# Patient Record
Sex: Female | Born: 1955
Health system: Southern US, Community
[De-identification: ages and names within clinical notes are randomized; demographics above are authoritative.]

## PROBLEM LIST (undated history)

## (undated) DIAGNOSIS — M1990 Unspecified osteoarthritis, unspecified site: Secondary | ICD-10-CM

## (undated) DIAGNOSIS — I219 Acute myocardial infarction, unspecified: Secondary | ICD-10-CM

## (undated) DIAGNOSIS — G709 Myoneural disorder, unspecified: Secondary | ICD-10-CM

## (undated) DIAGNOSIS — C801 Malignant (primary) neoplasm, unspecified: Secondary | ICD-10-CM

## (undated) DIAGNOSIS — K219 Gastro-esophageal reflux disease without esophagitis: Secondary | ICD-10-CM

## (undated) DIAGNOSIS — I2510 Atherosclerotic heart disease of native coronary artery without angina pectoris: Secondary | ICD-10-CM

## (undated) HISTORY — PX: TONSILLECTOMY: SUR1361

## (undated) HISTORY — PX: ABDOMINAL HYSTERECTOMY: SHX81

## (undated) HISTORY — PX: MASTECTOMY: SHX3

## (undated) HISTORY — PX: CARDIAC CATHETERIZATION: SHX172

---

## 2017-07-15 ENCOUNTER — Other Ambulatory Visit: Payer: Self-pay | Admitting: Neurosurgery

## 2017-08-05 NOTE — H&P (Signed)
Patient ID:   [id number] Patient: Destiny Dillon  Date of Birth: July 30, 1956 Visit Type: Office Visit   Date: 08/02/2017 12:45 PM Provider: Marchia Meiers. Vertell Limber MD   This 62 year old female presents for back pain.   History of Present Illness: 1.  back pain  Patient returns as scheduled noting her pain is significantly worse since previous visit in November. She describes sharp pain to the low back and left hip with sitting and standing. She is unable to bend. Patient reports she received pre-op clearance from her cardiac surgeon.           MEDICATIONS(added, continued or stopped this visit): Started Medication Directions Instruction Stopped   atorvastatin 40 mg tablet take 1 tablet by oral route  every day     celecoxib 200 mg capsule take 1 capsule by oral route  every day as needed     gabapentin 300 mg capsule take 1 capsule by oral route 3 times every day     hydrocodone 7.5 mg-acetaminophen 325 mg tablet take 1 tablet by oral route  every 4 hours as needed for pain     metoprolol tartrate 50 mg tablet take 1 tablet by oral route 2 times every day with meals     niacin 500 mg tablet take 1 tablet by oral route 3 times every day     ramipril 10 mg capsule take 1 capsule by oral route  every day     sertraline 50 mg tablet take 1 tablet by oral route  every day       ALLERGIES: Ingredient Reaction Medication Name Comment  CODEINE Lightheadedness    ERYTHROMYCIN BASE Lightheadedness; Vomiting     Reviewed, no changes.    Vitals Date Temp F BP Pulse Ht In Wt Lb BMI BSA Pain Score  08/02/2017  114/68 73 61 191.4 36.16  9/10      IMPRESSION Patient returns as scheduled with increased pain to the low back and left hip. MRI shows disc herniation and listhesis at L4-5. Stenosis noted at L5-S1. On confrontational testing, 4/5 left EHL and negative left seated SLR. Schedule L4-5 L5-S1 MAS PLIF.     Pain Management Plan Pain Scale: 9/10. Method: Numeric Pain  Intensity Scale. Location: lower back and left hip. Onset: 06/07/2014. Duration: varies. Quality: discomforting.  Schedule L4-5 L5-S1 MAS PLIF. Nurse education given. Fit for LSO brace.             Provider:  Vertell Limber MD, Marchia Meiers 08/02/2017 1:37 PM  Dictation edited by: Lucita Lora    CC Providers: Otho Darner  [address] [place] [address]-              Electronically signed by Marchia Meiers. Vertell Limber MD on 08/03/2017 05:25 PM  Patient ID:   [id number] Patient: Destiny Dillon  Date of Birth: May 15, 1956 Visit Type: Office Visit   Date: 06/07/2017 02:30 PM Provider: Marchia Meiers. Vertell Limber MD   This 62 year old female presents for back pain.   History of Present Illness: 1.  back pain  Destiny Dillon, 62 year old retired female visits for evaluation of lumbar and left greater than right leg pain and numbness.  Patient recalls no injury, noting symptoms increasing since 2015.  She notes primarily lumbar pain and left leg pain, occasionally right leg pain.  Sitting allows pain to dissipate.  Occasionally she will notice numbness in either leg while standing prolonged periods of time.  She reports both legs ache at night.  She reports currently 8/10 pain to the low back and hips. She notes bilateral leg pain at night. She notes she "does not have a life". She also reports actively losing weight and reports a 30 lb weight loss.  Physical therapy offered no relief Epidural steroid injections offered no relief RF ablation x2 offered no lasting relief  Gabapentin 300 milligrams q.h.s. Norco 7.5/325 usually taken 1 per day Celebrex 200 milligrams 1 per day  History:  MI 2010, depression/anxiety, CAD Surgical history:  Stents 2010(no longer requires anticoagulants), hysterectomy 2000, C-section 1987, tonsillectomy 1975  MRI September 2018 uploaded canopy, x-rays on canopy           MEDICATIONS(added, continued or stopped this  visit):   ALLERGIES:   Review of Systems System Neg/Pos Details  Constitutional Negative Chills, Fatigue, Fever, Malaise, Night sweats, Weight gain and Weight loss.  ENMT Negative Ear drainage, Hearing loss, Nasal drainage, Otalgia, Sinus pressure and Sore throat.  Eyes Negative Eye discharge, Eye pain and Vision changes.  Respiratory Negative Chronic cough, Cough, Dyspnea, Known TB exposure and Wheezing.  Cardio Negative Chest pain, Claudication, Edema and Irregular heartbeat/palpitations.  GI Negative Abdominal pain, Blood in stool, Change in stool pattern, Constipation, Decreased appetite, Diarrhea, Heartburn, Nausea and Vomiting.  GU Negative Dysuria, Hematuria, Polyuria (Genitourinary), Urinary frequency, Urinary incontinence and Urinary retention.  Endocrine Negative Cold intolerance, Heat intolerance, Polydipsia and Polyphagia.  Neuro Positive Numbness in extremity.  Psych Negative Anxiety, Depression and Insomnia.  Integumentary Negative Brittle hair, Brittle nails, Change in shape/size of mole(s), Hair loss, Hirsutism, Hives, Pruritus, Rash and Skin lesion.  MS Positive Back pain.  Hema/Lymph Negative Easy bleeding, Easy bruising and Lymphadenopathy.  Allergic/Immuno Negative Contact allergy, Environmental allergies, Food allergies and Seasonal allergies.  Reproductive Negative Breast discharge, Breast lumps, Dysmenorrhea, Dyspareunia, History of abnormal PAP smear, Hot flashes, Irregular menses and Vaginal discharge.   Vitals Date Temp F BP Pulse Ht In Wt Lb BMI BSA Pain Score  06/07/2017  116/74 68 61 196.2 37.07  7/10     PHYSICAL EXAM General Level of Distress: no acute distress Overall Appearance: normal  Head and Face  Right Left  Fundoscopic Exam:  normal normal    Cardiovascular Cardiac: regular rate and rhythm without murmur  Right Left  Carotid Pulses: normal normal  Respiratory Lungs: clear to auscultation  Neurological Orientation: normal Recent  and Remote Memory: normal Attention Span and Concentration:   normal Language: normal Fund of Knowledge: normal  Right Left Sensation: normal normal Upper Extremity Coordination: normal normal  Lower Extremity Coordination: normal normal  Musculoskeletal Gait and Station: normal  Right Left Upper Extremity Muscle Strength: normal normal Lower Extremity Muscle Strength: normal normal Upper Extremity Muscle Tone:  normal normal Lower Extremity Muscle Tone: normal normal   Motor Strength Upper and lower extremity motor strength was tested in the clinically pertinent muscles. Any abnormal findings will be noted below.   Right Left EHL: 4-/5 4/5   Deep Tendon Reflexes  Right Left Biceps: normal normal Triceps: normal normal Brachioradialis: normal normal Patellar: normal normal Achilles: normal normal  Cranial Nerves II. Optic Nerve/Visual Fields: normal III. Oculomotor: normal IV. Trochlear: normal V. Trigeminal: normal VI. Abducens: normal VII. Facial: normal VIII. Acoustic/Vestibular: normal IX. Glossopharyngeal: normal X. Vagus: normal XI. Spinal Accessory: normal XII. Hypoglossal: normal  Motor and other Tests Lhermittes: negative Rhomberg: negative Pronator drift: absent     Right Left Hoffman's: normal normal Clonus: normal normal Babinski: normal normal SLR: negative positive at 45 degrees  Patrick's Corky Sox): negative negative   Additional Findings:  Able to stand on toes and heels. No pain with palpation along the spine. 4-/5 right EHL. 4/5 right dorsiflexion. 4/5 left EHL. 4/5 left dorsiflexion. Full strength in upper extremities. Reflexes are symmetric. Fundoscopic exam is normal. Cranial nerve exam is normal. Negative SLR on the right. 45 degree on the left SLR. Negative Patrick's test bilaterally. 4/5 left hip abductor. 4-/5 hip abductor on the right. Bilateral neuropathy. Minimal vibration loss in bilateral legs.   DIAGNOSTIC RESULTS MRI  04/14/17: Multilevel spondylosis. L4-5: Increased moderate thecal sac stenosis. A disc bulge is demonstrated with central protrusion and caudal migration. There is bilateral neural foraminal narrowing without exiting nerve root much possible compression. L5-S1: Broad-based disc bulge possible encroachment upon the central aspects of S1 nerve roots. There is bilateral neural foraminal narrowing without exiting nerve root compromise and possible depression.    IMPRESSION Patient presents with low back and bilateral hip and leg pain, left worse than right. MRI shows ruptured disc at L4-5, neural foraminal stenosis noted at L4-5 and L5-S1. X-ray shows scoliosis and spondylosis of L4-5 (10 mm on neutral, 8 mm on extension, 12 mm on flexion). On confrontational testing, weakness in bilateral EHL hip abductor, and dorsiflexion. Neuropathy noted in bilateral legs. Patient will undergo cardiac evaluation for surgical clearance. Weight loss education given.  Completed Orders (this encounter) Order Details Reason Side Interpretation Result Initial Treatment Date Region  Lumbar Spine- AP/Lat/Flex/Ex      06/07/2017 All Levels to All Levels   Assessment/Plan # Detail Type Description   1. Assessment Lumbar radiculopathy (M54.16).           Pain Management Plan Pain Scale: 7/10. Method: Numeric Pain Intensity Scale. Location: back. Onset: 06/07/2014. Duration: varies. Quality: discomforting. Pain management follow-up plan of care: Patient taking medication as prescribed..  Patient will undergo cardiac evaluation for surgical clearance. Follow-up after evaluation with scoliosis x-rays.  My likely surgical recommendation will be for MAS PLIF L 45 and L 5 S 1 levels.  I have spoken with patient about the importance of weight loss.  Orders: Diagnostic Procedures: Assessment Procedure  M54.16 Lumbar Spine- AP/Lat/Flex/Ex             Provider:  Vertell Limber MD, Marchia Meiers 06/07/2017 3:56 PM  Dictation  edited by: Lucita Lora    CC Providers: Otho Darner  [address] [place] [address]-              Electronically signed by Marchia Meiers. Vertell Limber MD on 06/08/2017 10:44 AM

## 2017-08-05 NOTE — Pre-Procedure Instructions (Signed)
Destiny Dillon  08/05/2017      [id number] [address], [place] [address] Phone: [phone] Fax: [phone]    Your procedure is scheduled on January 11  Report to [facility] at [place].M.  Call this number if you have problems the morning of surgery:  [phone]   Remember:  Do not eat food or drink liquids after midnight.  Continue all medications as directed by your physician except follow these medication instructions before surgery below   Take these medicines the morning of surgery with A SIP OF WATER  acetaminophen (TYLENOL) if needed HYDROcodone-acetaminophen (NORCO)  metoprolol succinate (TOPROL-XL)  7 days prior to surgery STOP taking any Aspirin(unless otherwise instructed by your surgeon), Aleve, Naproxen, Ibuprofen, Motrin, Advil, Goody's, BC's, all herbal medications, fish oil, and all vitamins  Follow your doctors instructions regarding your Aspirin.  If no instructions were given by your doctor, then you will need to call the prescribing office office to get instructions.      Do not wear jewelry, make-up or nail polish.  Do not wear lotions, powders, or perfumes, or deodorant.  Do not shave 48 hours prior to surgery.  Men may shave face and neck.  Do not bring valuables to the hospital.  [facility] is not responsible for any belongings or valuables.  Contacts, dentures or bridgework may not be worn into surgery.  Leave your suitcase in the car.  After surgery it may be brought to your room.  For patients admitted to the hospital, discharge time will be determined by your treatment team.  Patients discharged the day of surgery will not be allowed to drive home.   Special instructions:   [facility]- Preparing For Surgery  Before surgery, you can play an important role. Because skin is not sterile, your skin needs to be as free of germs as possible. You can reduce the  number of germs on your skin by washing with CHG (chlorahexidine gluconate) Soap before surgery.  CHG is an antiseptic cleaner which kills germs and bonds with the skin to continue killing germs even after washing.  Please do not use if you have an allergy to CHG or antibacterial soaps. If your skin becomes reddened/irritated stop using the CHG.  Do not shave (including legs and underarms) for at least 48 hours prior to first CHG shower. It is OK to shave your face.  Please follow these instructions carefully.   1. Shower the NIGHT BEFORE SURGERY and the MORNING OF SURGERY with CHG.   2. If you chose to wash your hair, wash your hair first as usual with your normal shampoo.  3. After you shampoo, rinse your hair and body thoroughly to remove the shampoo.  4. Use CHG as you would any other liquid soap. You can apply CHG directly to the skin and wash gently with a scrungie or a clean washcloth.   5. Apply the CHG Soap to your body ONLY FROM THE NECK DOWN.  Do not use on open wounds or open sores. Avoid contact with your eyes, ears, mouth and genitals (private parts). Wash Face and genitals (private parts)  with your normal soap.  6. Wash thoroughly, paying special attention to the area where your surgery will be performed.  7. Thoroughly rinse your body with warm water from the neck down.  8. DO NOT shower/wash with your normal soap after using and rinsing off  the CHG Soap.  9. Pat yourself dry with a CLEAN TOWEL.  10. Wear CLEAN PAJAMAS to bed the night before surgery, wear comfortable clothes the morning of surgery  11. Place CLEAN SHEETS on your bed the night of your first shower and DO NOT SLEEP WITH PETS.    Day of Surgery: Do not apply any deodorants/lotions. Please wear clean clothes to the hospital/surgery center.      Please read over the following fact sheets that you were given.

## 2017-08-06 ENCOUNTER — Other Ambulatory Visit: Payer: Self-pay

## 2017-08-06 ENCOUNTER — Encounter (HOSPITAL_COMMUNITY): Payer: Self-pay | Admitting: Urology

## 2017-08-06 ENCOUNTER — Encounter (HOSPITAL_COMMUNITY)
Admission: RE | Admit: 2017-08-06 | Discharge: 2017-08-06 | Disposition: A | Discharge status: Home / Self Care | Payer: BLUE CROSS/BLUE SHIELD | Source: Ambulatory Visit | Attending: Neurosurgery | Admitting: Neurosurgery

## 2017-08-06 DIAGNOSIS — Z01812 Encounter for preprocedural laboratory examination: Secondary | ICD-10-CM | POA: Insufficient documentation

## 2017-08-06 DIAGNOSIS — I2510 Atherosclerotic heart disease of native coronary artery without angina pectoris: Secondary | ICD-10-CM | POA: Diagnosis present

## 2017-08-06 DIAGNOSIS — Z01810 Encounter for preprocedural cardiovascular examination: Secondary | ICD-10-CM | POA: Insufficient documentation

## 2017-08-06 HISTORY — DX: Acute myocardial infarction, unspecified: I21.9

## 2017-08-06 HISTORY — DX: Gastro-esophageal reflux disease without esophagitis: K21.9

## 2017-08-06 HISTORY — DX: Myoneural disorder, unspecified: G70.9

## 2017-08-06 HISTORY — DX: Unspecified osteoarthritis, unspecified site: M19.90

## 2017-08-06 HISTORY — DX: Atherosclerotic heart disease of native coronary artery without angina pectoris: I25.10

## 2017-08-06 LAB — CBC
HCT: 44.7 % (ref 36.0–46.0)
Hemoglobin: 14.2 g/dL (ref 12.0–15.0)
MCH: 31.9 pg (ref 26.0–34.0)
MCHC: 31.8 g/dL (ref 30.0–36.0)
MCV: 100.4 fL — ABNORMAL HIGH (ref 78.0–100.0)
Platelets: 268 10*3/uL (ref 150–400)
RBC: 4.45 MIL/uL (ref 3.87–5.11)
RDW: 11.7 % (ref 11.5–15.5)
WBC: 8.5 10*3/uL (ref 4.0–10.5)

## 2017-08-06 LAB — BASIC METABOLIC PANEL
Anion gap: 9 (ref 5–15)
BUN: 9 mg/dL (ref 6–20)
CALCIUM: 9.5 mg/dL (ref 8.9–10.3)
CO2: 24 mmol/L (ref 22–32)
CREATININE: 0.85 mg/dL (ref 0.44–1.00)
Chloride: 105 mmol/L (ref 101–111)
GLUCOSE: 123 mg/dL — AB (ref 65–99)
Potassium: 4.1 mmol/L (ref 3.5–5.1)
Sodium: 138 mmol/L (ref 135–145)

## 2017-08-06 LAB — TYPE AND SCREEN
ABO/RH(D): O POS
ANTIBODY SCREEN: NEGATIVE

## 2017-08-06 LAB — SURGICAL PCR SCREEN
MRSA, PCR: NEGATIVE
Staphylococcus aureus: NEGATIVE

## 2017-08-06 LAB — ABO/RH: ABO/RH(D): O POS

## 2017-08-06 NOTE — Progress Notes (Signed)
   08/06/17 1028  OBSTRUCTIVE SLEEP APNEA  Have you ever been diagnosed with sleep apnea through a sleep study? No  Do you snore loudly (loud enough to be heard through closed doors)?  1  Do you often feel tired, fatigued, or sleepy during the daytime (such as falling asleep during driving or talking to someone)? 0  Has anyone observed you stop breathing during your sleep? 0  Do you have, or are you being treated for high blood pressure? 1  BMI more than 35 kg/m2? 1  Age > 50 (1-yes) 1  Neck circumference greater than:Female 16 inches or larger, Female 17inches or larger? 1  Female Gender (Yes=1) 0  Obstructive Sleep Apnea Score 5  Score 5 or greater  Results sent to PCP

## 2017-08-06 NOTE — Progress Notes (Signed)
PCP - Pat Kocher Cardiologist - Dr. Burgess Estelle   EKG - 08/06/2017  Stress Test - 07/03/17 in care everywhere Cardiac Cath - 2010, at either [facility] or [facility]- not noted in care everywhere so request faxed to both hospitals.   Aspirin Instructions: Pt was instructed by cardiologist to keep raking Aspirin. Dr. Vertell Limber aware per patient.   Pt reports having recent kidney infection and antibiotics complete. Pt states she will call PCP today then call Dr. Melven Sartorius office as she is unsure if this has cleared up.   Anesthesia review: hx CAD with stents placed x2.   Patient denies shortness of breath, fever, cough and chest pain at PAT appointment   Patient verbalized understanding of instructions that were given to them at the PAT appointment. Patient was also instructed that they will need to review over the PAT instructions again at home before surgery.

## 2017-08-10 NOTE — Progress Notes (Signed)
Anesthesia Chart Review:  Pt is a 62 year old female scheduled for L4-5, L5-S1 maximum access PLIF on 08/13/2017 with Erline Levine, MD  - PCP is Pat Kocher, MD - Cardiologist is Royetta Crochet, MD. Last office visit 06/17/17 (notes in care everywhere)   PMH includes:  CAD (s/p DES to RCA 09/2008, s/p DES to RCA for in-stent restenosis 01/2009), GERD. Never smoker. BMI 36  Medications include: ASA 81 mg, Lipitor, metoprolol, Prilosec, ramipril  BP 115/63   Pulse 72   Temp 36.8 C   Resp 20   Ht 5\' 1"  (1.549 m)   Wt 191 lb (86.6 kg)   SpO2 97%   BMI 36.09 kg/m   Preoperative labs reviewed.    EKG 08/06/17: NSR  Stress echo 07/03/17 (care everywhere): - Normal exercise echocardiogram with no exercise-induced LV wall motion abnormalities at 91% of the maximal, age-predicted target heart rate. - The left ventricular chamber size is normal. Global left ventricular wall motion and contractility are within normal limits. The EF is estimated at 60-65%. Normal left ventricular diastolic filling is observed. - The right ventricular chamber size and systolic function are within normal limits. - There is trivial tricuspid regurgitation. - Patient followed a Bruce protocol. The patient exercised into stage 3. The total exercise duration was 6 and half minutes. The study was terminated because of fatigue. - The patient did not express feelings of chest discomfort. ST segment response to stress was normal.  Exercise capacity is fair. The patient achieved a level of 7 METS. - There were rare ventricular premature beats. - This is a negative electrocardiographic stress test. - This was a negative echocardiographic stress test.  Cardiac cath 08/15/08 [facility]): 1. LM normal 2. LCx non-dominant. Gives origin to a small OM1, and medium OM 2. OM to completely occluded at its origin and fills by bridging collaterals. Distal circumflex in the AV groove quite small, and a small distal posterolateral  branch is also quite small. 3. LAD diffusely diseased throughout its course; midsegment long tubular area of 50-60% stenosis. LAD gives off 2 proximal very small diagonal branches which have diffuse plaque, and a large D3 branch has diffuse luminal irregularities but no focal stenotic lesions. 4. Ramus intermedius has diffuse plaque disease. However no focal stenotic lesions identified 5. RCA is dominant. Gives origin to posterior descending and posterior lateral branches. RCA has diffuse luminal irregularities. Subtotal occlusion of the distal RCA proximal to its bifurcation. This is high-grade distal stenosis represents culprit lesion in this patient's ACS.  S/p DES to RCA  If no changes, I anticipate pt can proceed with surgery as scheduled.   Willeen Cass, FNP-BC [facility]Anesthesiology Phone: [phone] 08/10/2017 3:06 PM

## 2017-08-12 NOTE — Anesthesia Preprocedure Evaluation (Signed)
Anesthesia Evaluation  Patient identified by MRN, date of birth, ID band Patient awake    Reviewed: Allergy & Precautions, NPO status , Patient's Chart, lab work & pertinent test results  Airway Mallampati: II  TM Distance: >3 FB Neck ROM: Full    Dental no notable dental hx.    Pulmonary neg pulmonary ROS,    Pulmonary exam normal breath sounds clear to auscultation       Cardiovascular + CAD and + Past MI  Normal cardiovascular exam Rhythm:Regular Rate:Normal     Neuro/Psych  Neuromuscular disease negative psych ROS   GI/Hepatic Neg liver ROS, GERD  ,  Endo/Other  negative endocrine ROS  Renal/GU negative Renal ROS     Musculoskeletal negative musculoskeletal ROS (+)   Abdominal   Peds  Hematology negative hematology ROS (+)   Anesthesia Other Findings   Reproductive/Obstetrics negative OB ROS                             Anesthesia Physical Anesthesia Plan  ASA: III  Anesthesia Plan: General   Post-op Pain Management:    Induction: Intravenous  PONV Risk Score and Plan: 4 or greater and Dexamethasone, Ondansetron, Midazolam and Scopolamine patch - Pre-op  Airway Management Planned: Oral ETT  Additional Equipment: Arterial line  Intra-op Plan:   Post-operative Plan: Extubation in OR  Informed Consent: I have reviewed the patients History and Physical, chart, labs and discussed the procedure including the risks, benefits and alternatives for the proposed anesthesia with the patient or authorized representative who has indicated his/her understanding and acceptance.   Dental advisory given  Plan Discussed with: CRNA  Anesthesia Plan Comments:        Anesthesia Quick Evaluation

## 2017-08-13 ENCOUNTER — Inpatient Hospital Stay (HOSPITAL_COMMUNITY): Payer: BLUE CROSS/BLUE SHIELD

## 2017-08-13 ENCOUNTER — Other Ambulatory Visit: Payer: Self-pay

## 2017-08-13 ENCOUNTER — Encounter (HOSPITAL_COMMUNITY): Payer: Self-pay | Admitting: Surgery

## 2017-08-13 ENCOUNTER — Inpatient Hospital Stay (HOSPITAL_COMMUNITY)
Admission: RE | Admit: 2017-08-13 | Discharge: 2017-08-15 | DRG: 455 | Disposition: A | Discharge status: Home / Self Care | Payer: BLUE CROSS/BLUE SHIELD | Source: Ambulatory Visit | Attending: Neurosurgery | Admitting: Neurosurgery

## 2017-08-13 ENCOUNTER — Inpatient Hospital Stay (HOSPITAL_COMMUNITY): Payer: BLUE CROSS/BLUE SHIELD | Admitting: Emergency Medicine

## 2017-08-13 ENCOUNTER — Encounter (HOSPITAL_COMMUNITY)
Admission: RE | Disposition: A | Discharge status: Home / Self Care | Payer: Self-pay | Source: Ambulatory Visit | Attending: Neurosurgery

## 2017-08-13 ENCOUNTER — Inpatient Hospital Stay (HOSPITAL_COMMUNITY): Payer: BLUE CROSS/BLUE SHIELD | Admitting: Anesthesiology

## 2017-08-13 DIAGNOSIS — M5116 Intervertebral disc disorders with radiculopathy, lumbar region: Secondary | ICD-10-CM | POA: Diagnosis present

## 2017-08-13 DIAGNOSIS — Z79899 Other long term (current) drug therapy: Secondary | ICD-10-CM

## 2017-08-13 DIAGNOSIS — Z419 Encounter for procedure for purposes other than remedying health state, unspecified: Secondary | ICD-10-CM

## 2017-08-13 DIAGNOSIS — F419 Anxiety disorder, unspecified: Secondary | ICD-10-CM | POA: Diagnosis present

## 2017-08-13 DIAGNOSIS — I2510 Atherosclerotic heart disease of native coronary artery without angina pectoris: Secondary | ICD-10-CM | POA: Diagnosis present

## 2017-08-13 DIAGNOSIS — F329 Major depressive disorder, single episode, unspecified: Secondary | ICD-10-CM | POA: Diagnosis present

## 2017-08-13 DIAGNOSIS — M48061 Spinal stenosis, lumbar region without neurogenic claudication: Principal | ICD-10-CM | POA: Diagnosis present

## 2017-08-13 DIAGNOSIS — Z888 Allergy status to other drugs, medicaments and biological substances status: Secondary | ICD-10-CM

## 2017-08-13 DIAGNOSIS — M4726 Other spondylosis with radiculopathy, lumbar region: Secondary | ICD-10-CM | POA: Diagnosis present

## 2017-08-13 DIAGNOSIS — I252 Old myocardial infarction: Secondary | ICD-10-CM

## 2017-08-13 DIAGNOSIS — Z955 Presence of coronary angioplasty implant and graft: Secondary | ICD-10-CM | POA: Diagnosis not present

## 2017-08-13 DIAGNOSIS — M4316 Spondylolisthesis, lumbar region: Secondary | ICD-10-CM | POA: Diagnosis present

## 2017-08-13 DIAGNOSIS — M549 Dorsalgia, unspecified: Secondary | ICD-10-CM | POA: Diagnosis present

## 2017-08-13 HISTORY — PX: MAXIMUM ACCESS (MAS)POSTERIOR LUMBAR INTERBODY FUSION (PLIF) 2 LEVEL: SHX6369

## 2017-08-13 SURGERY — FOR MAXIMUM ACCESS (MAS) POSTERIOR LUMBAR INTERBODY FUSION (PLIF) 2 LEVEL
Anesthesia: General | Site: Back

## 2017-08-13 MED ORDER — ACETAMINOPHEN 650 MG RE SUPP: 650.0000 mg | RECTAL | Status: DC | PRN

## 2017-08-13 MED ORDER — BUPIVACAINE LIPOSOME 1.3 % IJ SUSP
20.0000 mL | INTRAMUSCULAR | Status: DC
  Filled 2017-08-13: qty 20

## 2017-08-13 MED ORDER — PANTOPRAZOLE SODIUM 40 MG PO TBEC
40.0000 mg | DELAYED_RELEASE_TABLET | Freq: Every day | ORAL | Status: DC
  Administered 2017-08-14: 40 mg via ORAL
  Filled 2017-08-13: qty 1

## 2017-08-13 MED ORDER — METHOCARBAMOL 1000 MG/10ML IJ SOLN
500.0000 mg | Freq: Four times a day (QID) | INTRAMUSCULAR | Status: DC | PRN
  Filled 2017-08-13: qty 5

## 2017-08-13 MED ORDER — HEMOSTATIC AGENTS (NO CHARGE) OPTIME
TOPICAL | Status: DC | PRN
  Administered 2017-08-13: 1 via TOPICAL

## 2017-08-13 MED ORDER — SODIUM CHLORIDE 0.9 % IJ SOLN
INTRAMUSCULAR | Status: AC
  Filled 2017-08-13: qty 10

## 2017-08-13 MED ORDER — ZOLPIDEM TARTRATE 5 MG PO TABS: 5.0000 mg | ORAL_TABLET | Freq: Every evening | ORAL | Status: DC | PRN

## 2017-08-13 MED ORDER — CAL MAG ZINC +D3 PO TABS: ORAL_TABLET | Freq: Every day | ORAL | Status: DC

## 2017-08-13 MED ORDER — CHLORHEXIDINE GLUCONATE CLOTH 2 % EX PADS: 6.0000 | MEDICATED_PAD | Freq: Once | CUTANEOUS | Status: DC

## 2017-08-13 MED ORDER — SODIUM CHLORIDE 0.9 % IV SOLN: 250.0000 mL | INTRAVENOUS | Status: DC

## 2017-08-13 MED ORDER — MENTHOL 3 MG MT LOZG: 1.0000 | LOZENGE | OROMUCOSAL | Status: DC | PRN

## 2017-08-13 MED ORDER — 0.9 % SODIUM CHLORIDE (POUR BTL) OPTIME
TOPICAL | Status: DC | PRN
  Administered 2017-08-13: 1000 mL

## 2017-08-13 MED ORDER — ACETAMINOPHEN 500 MG PO TABS: 1000.0000 mg | ORAL_TABLET | Freq: Three times a day (TID) | ORAL | Status: DC | PRN

## 2017-08-13 MED ORDER — HYDROMORPHONE HCL 1 MG/ML IJ SOLN
0.2500 mg | INTRAMUSCULAR | Status: DC | PRN
  Administered 2017-08-13 (×2): 0.5 mg via INTRAVENOUS

## 2017-08-13 MED ORDER — VITAMIN D3 25 MCG (1000 UNIT) PO TABS
5000.0000 [IU] | ORAL_TABLET | Freq: Every day | ORAL | Status: DC
  Administered 2017-08-14: 5000 [IU] via ORAL
  Filled 2017-08-13 (×3): qty 5

## 2017-08-13 MED ORDER — HYDROCODONE-ACETAMINOPHEN 7.5-325 MG PO TABS: 0.5000 | ORAL_TABLET | Freq: Four times a day (QID) | ORAL | Status: DC | PRN

## 2017-08-13 MED ORDER — HYDROMORPHONE HCL 1 MG/ML IJ SOLN
INTRAMUSCULAR | Status: AC
  Filled 2017-08-13: qty 1

## 2017-08-13 MED ORDER — PHENYLEPHRINE HCL 10 MG/ML IJ SOLN
INTRAMUSCULAR | Status: DC | PRN
  Administered 2017-08-13: 40 ug via INTRAVENOUS
  Administered 2017-08-13: 80 ug via INTRAVENOUS
  Administered 2017-08-13: 40 ug via INTRAVENOUS
  Administered 2017-08-13: 80 ug via INTRAVENOUS

## 2017-08-13 MED ORDER — ACYCLOVIR 400 MG PO TABS: 400.0000 mg | ORAL_TABLET | Freq: Two times a day (BID) | ORAL | Status: DC | PRN

## 2017-08-13 MED ORDER — PHENYLEPHRINE HCL 10 MG/ML IJ SOLN
INTRAMUSCULAR | Status: AC
  Filled 2017-08-13: qty 1

## 2017-08-13 MED ORDER — ALUM & MAG HYDROXIDE-SIMETH 200-200-20 MG/5ML PO SUSP: 30.0000 mL | Freq: Four times a day (QID) | ORAL | Status: DC | PRN

## 2017-08-13 MED ORDER — RISAQUAD PO CAPS
ORAL_CAPSULE | Freq: Every day | ORAL | Status: DC
  Administered 2017-08-14: 1 via ORAL
  Filled 2017-08-13 (×2): qty 1

## 2017-08-13 MED ORDER — SUFENTANIL CITRATE 50 MCG/ML IV SOLN
INTRAVENOUS | Status: AC
  Filled 2017-08-13: qty 1

## 2017-08-13 MED ORDER — KCL IN DEXTROSE-NACL 20-5-0.45 MEQ/L-%-% IV SOLN: INTRAVENOUS | Status: DC

## 2017-08-13 MED ORDER — PROPOFOL 10 MG/ML IV BOLUS
INTRAVENOUS | Status: AC
  Filled 2017-08-13: qty 20

## 2017-08-13 MED ORDER — METHOCARBAMOL 500 MG PO TABS
ORAL_TABLET | ORAL | Status: AC
  Filled 2017-08-13: qty 1

## 2017-08-13 MED ORDER — DEXAMETHASONE SODIUM PHOSPHATE 10 MG/ML IJ SOLN
INTRAMUSCULAR | Status: AC
  Filled 2017-08-13: qty 1

## 2017-08-13 MED ORDER — MORPHINE SULFATE (PF) 4 MG/ML IV SOLN: 2.0000 mg | INTRAVENOUS | Status: DC | PRN

## 2017-08-13 MED ORDER — PHENYLEPHRINE HCL 10 MG/ML IJ SOLN
INTRAVENOUS | Status: DC | PRN
  Administered 2017-08-13: 25 ug/min via INTRAVENOUS

## 2017-08-13 MED ORDER — DEXAMETHASONE SODIUM PHOSPHATE 10 MG/ML IJ SOLN
INTRAMUSCULAR | Status: DC | PRN
  Administered 2017-08-13: 10 mg via INTRAVENOUS

## 2017-08-13 MED ORDER — THROMBIN (RECOMBINANT) 5000 UNITS EX SOLR
CUTANEOUS | Status: DC | PRN
  Administered 2017-08-13: 5000 [IU] via TOPICAL

## 2017-08-13 MED ORDER — MIDAZOLAM HCL 5 MG/5ML IJ SOLN
INTRAMUSCULAR | Status: DC | PRN
  Administered 2017-08-13: 2 mg via INTRAVENOUS

## 2017-08-13 MED ORDER — SERTRALINE HCL 50 MG PO TABS
50.0000 mg | ORAL_TABLET | Freq: Every day | ORAL | Status: DC
  Administered 2017-08-13 – 2017-08-14 (×2): 50 mg via ORAL
  Filled 2017-08-13 (×3): qty 1

## 2017-08-13 MED ORDER — LIDOCAINE-EPINEPHRINE 1 %-1:100000 IJ SOLN
INTRAMUSCULAR | Status: DC | PRN
  Administered 2017-08-13: 10 mL

## 2017-08-13 MED ORDER — BUPIVACAINE HCL (PF) 0.5 % IJ SOLN
INTRAMUSCULAR | Status: AC
Start: 2017-08-13 — End: 2017-08-13
  Filled 2017-08-13: qty 30

## 2017-08-13 MED ORDER — NIACIN ER (ANTIHYPERLIPIDEMIC) 500 MG PO TBCR
500.0000 mg | EXTENDED_RELEASE_TABLET | Freq: Every day | ORAL | Status: DC
  Administered 2017-08-13 – 2017-08-14 (×2): 500 mg via ORAL
  Filled 2017-08-13 (×2): qty 1

## 2017-08-13 MED ORDER — METHOCARBAMOL 500 MG PO TABS
500.0000 mg | ORAL_TABLET | Freq: Four times a day (QID) | ORAL | Status: DC | PRN
  Administered 2017-08-13 – 2017-08-14 (×3): 500 mg via ORAL
  Filled 2017-08-13 (×2): qty 1

## 2017-08-13 MED ORDER — CEFAZOLIN SODIUM-DEXTROSE 2-4 GM/100ML-% IV SOLN
2.0000 g | INTRAVENOUS | Status: AC
  Administered 2017-08-13 (×2): 2 g via INTRAVENOUS
  Filled 2017-08-13: qty 100

## 2017-08-13 MED ORDER — VITAMIN E 180 MG (400 UNIT) PO CAPS
400.0000 [IU] | ORAL_CAPSULE | Freq: Every day | ORAL | Status: DC
  Administered 2017-08-14: 400 [IU] via ORAL
  Filled 2017-08-13: qty 1

## 2017-08-13 MED ORDER — THROMBIN (RECOMBINANT) 5000 UNITS EX SOLR
CUTANEOUS | Status: AC
  Filled 2017-08-13: qty 10000

## 2017-08-13 MED ORDER — RAMIPRIL 5 MG PO CAPS
10.0000 mg | ORAL_CAPSULE | Freq: Every evening | ORAL | Status: DC
  Administered 2017-08-14: 10 mg via ORAL
  Filled 2017-08-13: qty 2

## 2017-08-13 MED ORDER — ACETAMINOPHEN 325 MG PO TABS: 650.0000 mg | ORAL_TABLET | ORAL | Status: DC | PRN

## 2017-08-13 MED ORDER — CEFAZOLIN SODIUM-DEXTROSE 2-4 GM/100ML-% IV SOLN
2.0000 g | Freq: Three times a day (TID) | INTRAVENOUS | Status: AC
  Administered 2017-08-13 (×2): 2 g via INTRAVENOUS
  Filled 2017-08-13: qty 100

## 2017-08-13 MED ORDER — SODIUM CHLORIDE 0.9% FLUSH
3.0000 mL | Freq: Two times a day (BID) | INTRAVENOUS | Status: DC
  Administered 2017-08-13 – 2017-08-14 (×3): 3 mL via INTRAVENOUS

## 2017-08-13 MED ORDER — DIPHENHYDRAMINE HCL 25 MG PO CAPS
25.0000 mg | ORAL_CAPSULE | Freq: Every day | ORAL | Status: DC
  Administered 2017-08-13 – 2017-08-14 (×2): 25 mg via ORAL
  Filled 2017-08-13 (×2): qty 1

## 2017-08-13 MED ORDER — EPHEDRINE SULFATE-NACL 50-0.9 MG/10ML-% IV SOSY
PREFILLED_SYRINGE | INTRAVENOUS | Status: DC | PRN
  Administered 2017-08-13: 10 mg via INTRAVENOUS

## 2017-08-13 MED ORDER — SUCCINYLCHOLINE CHLORIDE 200 MG/10ML IV SOSY
PREFILLED_SYRINGE | INTRAVENOUS | Status: DC | PRN
  Administered 2017-08-13: 80 mg via INTRAVENOUS

## 2017-08-13 MED ORDER — HYDROCODONE-ACETAMINOPHEN 5-325 MG PO TABS: 1.0000 | ORAL_TABLET | ORAL | Status: DC | PRN

## 2017-08-13 MED ORDER — PANTOPRAZOLE SODIUM 40 MG IV SOLR: 40.0000 mg | Freq: Every day | INTRAVENOUS | Status: DC

## 2017-08-13 MED ORDER — LIDOCAINE-EPINEPHRINE 1 %-1:100000 IJ SOLN
INTRAMUSCULAR | Status: AC
  Filled 2017-08-13: qty 1

## 2017-08-13 MED ORDER — METOPROLOL SUCCINATE ER 50 MG PO TB24
50.0000 mg | ORAL_TABLET | Freq: Every day | ORAL | Status: DC
  Administered 2017-08-14: 50 mg via ORAL
  Filled 2017-08-13: qty 1

## 2017-08-13 MED ORDER — CEFAZOLIN SODIUM-DEXTROSE 2-4 GM/100ML-% IV SOLN
INTRAVENOUS | Status: AC
  Filled 2017-08-13: qty 100

## 2017-08-13 MED ORDER — ATORVASTATIN CALCIUM 40 MG PO TABS
40.0000 mg | ORAL_TABLET | Freq: Every day | ORAL | Status: DC
  Administered 2017-08-14: 40 mg via ORAL
  Filled 2017-08-13: qty 1

## 2017-08-13 MED ORDER — CELECOXIB 200 MG PO CAPS
200.0000 mg | ORAL_CAPSULE | Freq: Two times a day (BID) | ORAL | Status: DC
  Administered 2017-08-13 – 2017-08-14 (×3): 200 mg via ORAL
  Filled 2017-08-13 (×3): qty 1

## 2017-08-13 MED ORDER — BUPIVACAINE LIPOSOME 1.3 % IJ SUSP
INTRAMUSCULAR | Status: DC | PRN
  Administered 2017-08-13: 20 mL

## 2017-08-13 MED ORDER — SUFENTANIL CITRATE 50 MCG/ML IV SOLN
INTRAVENOUS | Status: DC | PRN
  Administered 2017-08-13: 20 ug via INTRAVENOUS
  Administered 2017-08-13: 10 ug via INTRAVENOUS

## 2017-08-13 MED ORDER — PROPOFOL 1000 MG/100ML IV EMUL
INTRAVENOUS | Status: AC
  Filled 2017-08-13: qty 100

## 2017-08-13 MED ORDER — BISACODYL 10 MG RE SUPP: 10.0000 mg | Freq: Every day | RECTAL | Status: DC | PRN

## 2017-08-13 MED ORDER — CEFAZOLIN SODIUM 1 G IJ SOLR
INTRAMUSCULAR | Status: AC
Start: 2017-08-13 — End: 2017-08-13
  Filled 2017-08-13: qty 20

## 2017-08-13 MED ORDER — DIPHENHYDRAMINE HCL 25 MG PO TABS
25.0000 mg | ORAL_TABLET | Freq: Every day | ORAL | Status: DC
  Filled 2017-08-13: qty 1

## 2017-08-13 MED ORDER — LACTATED RINGERS IV SOLN
INTRAVENOUS | Status: DC | PRN
  Administered 2017-08-13 (×2): via INTRAVENOUS

## 2017-08-13 MED ORDER — POLYETHYLENE GLYCOL 3350 17 G PO PACK
17.0000 g | PACK | Freq: Every day | ORAL | Status: DC | PRN
  Administered 2017-08-14: 17 g via ORAL
  Filled 2017-08-13 (×2): qty 1

## 2017-08-13 MED ORDER — B COMPLEX-C PO TABS
1.0000 | ORAL_TABLET | Freq: Every day | ORAL | Status: DC
  Filled 2017-08-13: qty 1

## 2017-08-13 MED ORDER — ONDANSETRON HCL 4 MG/2ML IJ SOLN
INTRAMUSCULAR | Status: DC | PRN
  Administered 2017-08-13: 4 mg via INTRAVENOUS

## 2017-08-13 MED ORDER — SODIUM CHLORIDE 0.9% FLUSH: 3.0000 mL | INTRAVENOUS | Status: DC | PRN

## 2017-08-13 MED ORDER — MIDAZOLAM HCL 2 MG/2ML IJ SOLN
INTRAMUSCULAR | Status: AC
  Filled 2017-08-13: qty 2

## 2017-08-13 MED ORDER — HYDROCODONE-ACETAMINOPHEN 5-325 MG PO TABS
ORAL_TABLET | ORAL | Status: AC
  Filled 2017-08-13: qty 2

## 2017-08-13 MED ORDER — VITAMIN C 500 MG PO TABS
500.0000 mg | ORAL_TABLET | Freq: Every day | ORAL | Status: DC
  Administered 2017-08-14: 500 mg via ORAL
  Filled 2017-08-13: qty 1

## 2017-08-13 MED ORDER — DOCUSATE SODIUM 100 MG PO CAPS
100.0000 mg | ORAL_CAPSULE | Freq: Two times a day (BID) | ORAL | Status: DC
  Administered 2017-08-13 – 2017-08-14 (×3): 100 mg via ORAL
  Filled 2017-08-13 (×3): qty 1

## 2017-08-13 MED ORDER — MEPERIDINE HCL 25 MG/ML IJ SOLN: 6.2500 mg | INTRAMUSCULAR | Status: DC | PRN

## 2017-08-13 MED ORDER — ONDANSETRON HCL 4 MG PO TABS: 4.0000 mg | ORAL_TABLET | Freq: Four times a day (QID) | ORAL | Status: DC | PRN

## 2017-08-13 MED ORDER — PHENOL 1.4 % MT LIQD: 1.0000 | OROMUCOSAL | Status: DC | PRN

## 2017-08-13 MED ORDER — BUPIVACAINE HCL (PF) 0.5 % IJ SOLN
INTRAMUSCULAR | Status: DC | PRN
  Administered 2017-08-13: 10 mL

## 2017-08-13 MED ORDER — PROMETHAZINE HCL 25 MG/ML IJ SOLN: 6.2500 mg | INTRAMUSCULAR | Status: DC | PRN

## 2017-08-13 MED ORDER — FLEET ENEMA 7-19 GM/118ML RE ENEM: 1.0000 | ENEMA | Freq: Once | RECTAL | Status: DC | PRN

## 2017-08-13 MED ORDER — PROPOFOL 10 MG/ML IV BOLUS
INTRAVENOUS | Status: DC | PRN
  Administered 2017-08-13: 140 mg via INTRAVENOUS
  Administered 2017-08-13: 60 mg via INTRAVENOUS

## 2017-08-13 MED ORDER — SCOPOLAMINE 1 MG/3DAYS TD PT72
MEDICATED_PATCH | TRANSDERMAL | Status: DC | PRN
  Administered 2017-08-13: 1 via TRANSDERMAL

## 2017-08-13 MED ORDER — HYDROCODONE-ACETAMINOPHEN 5-325 MG PO TABS
2.0000 | ORAL_TABLET | ORAL | Status: DC | PRN
  Administered 2017-08-13 – 2017-08-15 (×9): 2 via ORAL
  Filled 2017-08-13 (×7): qty 2

## 2017-08-13 MED ORDER — ONDANSETRON HCL 4 MG/2ML IJ SOLN: 4.0000 mg | Freq: Four times a day (QID) | INTRAMUSCULAR | Status: DC | PRN

## 2017-08-13 MED ORDER — ONDANSETRON HCL 4 MG/2ML IJ SOLN
INTRAMUSCULAR | Status: AC
  Filled 2017-08-13: qty 2

## 2017-08-13 MED ORDER — ASPIRIN EC 81 MG PO TBEC
81.0000 mg | DELAYED_RELEASE_TABLET | Freq: Every day | ORAL | Status: DC
  Administered 2017-08-14: 81 mg via ORAL
  Filled 2017-08-13: qty 1

## 2017-08-13 MED ORDER — LIDOCAINE 2% (20 MG/ML) 5 ML SYRINGE
INTRAMUSCULAR | Status: DC | PRN
  Administered 2017-08-13: 100 mg via INTRAVENOUS

## 2017-08-13 SURGICAL SUPPLY — 88 items
BASKET BONE COLLECTION (BASKET) ×3 IMPLANT
BLADE CLIPPER SURG (BLADE) IMPLANT
BONE CANC CHIPS 40CC CAN1/2 (Bone Implant) ×3 IMPLANT
BUR MATCHSTICK NEURO 3.0 LAGG (BURR) ×3 IMPLANT
BUR PRECISION FLUTE 5.0 (BURR) ×3 IMPLANT
BUR ROUND FLUTED 5 RND (BURR) IMPLANT
BUR ROUND FLUTED 5MM RND (BURR)
CAGE COROENT PLIF 10X28-8 LUMB (Cage) ×6 IMPLANT
CAGE PLIF 8X9X23-12 LUMBAR (Cage) ×6 IMPLANT
CANISTER SUCT 3000ML PPV (MISCELLANEOUS) ×3 IMPLANT
CAP RELINE MOD TULIP RMM (Cap) ×6 IMPLANT
CARTRIDGE OIL MAESTRO DRILL (MISCELLANEOUS) ×1 IMPLANT
CHIPS CANC BONE 40CC CAN1/2 (Bone Implant) ×1 IMPLANT
CLIP NEUROVISION LG (CLIP) ×3 IMPLANT
CONT SPEC 4OZ CLIKSEAL STRL BL (MISCELLANEOUS) ×3 IMPLANT
COVER BACK TABLE 24X17X13 BIG (DRAPES) IMPLANT
COVER BACK TABLE 60X90IN (DRAPES) ×3 IMPLANT
DECANTER SPIKE VIAL GLASS SM (MISCELLANEOUS) ×3 IMPLANT
DERMABOND ADVANCED (GAUZE/BANDAGES/DRESSINGS) ×2
DERMABOND ADVANCED .7 DNX12 (GAUZE/BANDAGES/DRESSINGS) ×1 IMPLANT
DIFFUSER DRILL AIR PNEUMATIC (MISCELLANEOUS) ×3 IMPLANT
DRAPE C-ARM 42X72 X-RAY (DRAPES) ×3 IMPLANT
DRAPE C-ARMOR (DRAPES) ×3 IMPLANT
DRAPE LAPAROTOMY 100X72X124 (DRAPES) ×3 IMPLANT
DRAPE POUCH INSTRU U-SHP 10X18 (DRAPES) ×3 IMPLANT
DRAPE SURG 17X23 STRL (DRAPES) ×3 IMPLANT
DRSG OPSITE POSTOP 4X6 (GAUZE/BANDAGES/DRESSINGS) ×3 IMPLANT
DURAPREP 26ML APPLICATOR (WOUND CARE) ×3 IMPLANT
ELECT BLADE 4.0 EZ CLEAN MEGAD (MISCELLANEOUS) ×3
ELECT REM PT RETURN 9FT ADLT (ELECTROSURGICAL) ×3
ELECTRODE BLDE 4.0 EZ CLN MEGD (MISCELLANEOUS) ×1 IMPLANT
ELECTRODE REM PT RTRN 9FT ADLT (ELECTROSURGICAL) ×1 IMPLANT
EVACUATOR 1/8 PVC DRAIN (DRAIN) IMPLANT
GAUZE SPONGE 4X4 12PLY STRL (GAUZE/BANDAGES/DRESSINGS) IMPLANT
GAUZE SPONGE 4X4 16PLY XRAY LF (GAUZE/BANDAGES/DRESSINGS) IMPLANT
GLOVE BIO SURGEON STRL SZ8 (GLOVE) ×6 IMPLANT
GLOVE BIOGEL PI IND STRL 7.5 (GLOVE) ×2 IMPLANT
GLOVE BIOGEL PI IND STRL 8 (GLOVE) ×6 IMPLANT
GLOVE BIOGEL PI IND STRL 8.5 (GLOVE) ×2 IMPLANT
GLOVE BIOGEL PI INDICATOR 7.5 (GLOVE) ×4
GLOVE BIOGEL PI INDICATOR 8 (GLOVE) ×12
GLOVE BIOGEL PI INDICATOR 8.5 (GLOVE) ×4
GLOVE ECLIPSE 7.0 STRL STRAW (GLOVE) ×3 IMPLANT
GLOVE ECLIPSE 7.5 STRL STRAW (GLOVE) ×9 IMPLANT
GLOVE ECLIPSE 8.0 STRL XLNG CF (GLOVE) ×6 IMPLANT
GLOVE EXAM NITRILE LRG STRL (GLOVE) IMPLANT
GLOVE EXAM NITRILE XL STR (GLOVE) IMPLANT
GLOVE EXAM NITRILE XS STR PU (GLOVE) IMPLANT
GOWN STRL REUS W/ TWL LRG LVL3 (GOWN DISPOSABLE) ×1 IMPLANT
GOWN STRL REUS W/ TWL XL LVL3 (GOWN DISPOSABLE) ×2 IMPLANT
GOWN STRL REUS W/TWL 2XL LVL3 (GOWN DISPOSABLE) ×12 IMPLANT
GOWN STRL REUS W/TWL LRG LVL3 (GOWN DISPOSABLE) ×2
GOWN STRL REUS W/TWL XL LVL3 (GOWN DISPOSABLE) ×4
HEMOSTAT POWDER KIT SURGIFOAM (HEMOSTASIS) ×3 IMPLANT
KIT BASIN OR (CUSTOM PROCEDURE TRAY) ×3 IMPLANT
KIT POSITION SURG JACKSON T1 (MISCELLANEOUS) ×3 IMPLANT
KIT ROOM TURNOVER OR (KITS) ×3 IMPLANT
MILL MEDIUM DISP (BLADE) ×3 IMPLANT
MODULE NVM5 NEXT GEN EMG (NEEDLE) ×3 IMPLANT
NEEDLE HYPO 22GX1.5 SAFETY (NEEDLE) ×3 IMPLANT
NEEDLE HYPO 25X1 1.5 SAFETY (NEEDLE) ×3 IMPLANT
NEEDLE SPNL 18GX3.5 QUINCKE PK (NEEDLE) ×3 IMPLANT
NS IRRIG 1000ML POUR BTL (IV SOLUTION) ×3 IMPLANT
OIL CARTRIDGE MAESTRO DRILL (MISCELLANEOUS) ×3
PACK LAMINECTOMY NEURO (CUSTOM PROCEDURE TRAY) ×3 IMPLANT
PAD ARMBOARD 7.5X6 YLW CONV (MISCELLANEOUS) ×6 IMPLANT
PATTIES SURGICAL .5 X.5 (GAUZE/BANDAGES/DRESSINGS) IMPLANT
PATTIES SURGICAL .5 X1 (DISPOSABLE) IMPLANT
PATTIES SURGICAL 1X1 (DISPOSABLE) IMPLANT
ROD RELINE COCR 5.0X60MM (Rod) ×6 IMPLANT
SCREW LOCK RSS 4.5/5.0MM (Screw) ×18 IMPLANT
SCREW RELINE RMM 5.5X35 4S (Screw) ×6 IMPLANT
SCREW RELINE RMM 6.5X35 4S (Screw) ×6 IMPLANT
SCREW SHANK RELINE MOD 5.5X35 (Screw) ×6 IMPLANT
SPONGE LAP 4X18 X RAY DECT (DISPOSABLE) IMPLANT
SPONGE SURGIFOAM ABS GEL 100 (HEMOSTASIS) IMPLANT
STAPLER SKIN PROX WIDE 3.9 (STAPLE) IMPLANT
SUT VIC AB 1 CT1 18XBRD ANBCTR (SUTURE) ×2 IMPLANT
SUT VIC AB 1 CT1 8-18 (SUTURE) ×4
SUT VIC AB 2-0 CT1 18 (SUTURE) ×6 IMPLANT
SUT VIC AB 3-0 SH 8-18 (SUTURE) ×6 IMPLANT
SYR 3ML LL SCALE MARK (SYRINGE) IMPLANT
SYR 5ML LL (SYRINGE) IMPLANT
SYRINGE 20CC LL (MISCELLANEOUS) ×3 IMPLANT
TOWEL GREEN STERILE (TOWEL DISPOSABLE) ×3 IMPLANT
TOWEL GREEN STERILE FF (TOWEL DISPOSABLE) ×3 IMPLANT
TRAY FOLEY W/METER SILVER 16FR (SET/KITS/TRAYS/PACK) ×3 IMPLANT
WATER STERILE IRR 1000ML POUR (IV SOLUTION) ×3 IMPLANT

## 2017-08-13 NOTE — Plan of Care (Signed)
Per Dr. Vertell Limber Note 08/13/17 11:51 pharmaceutical VTE will be held >24 hours post surgery per bleed risk, pt compliant and understands SCDs will remain on while in bed per VTE.

## 2017-08-13 NOTE — Anesthesia Procedure Notes (Signed)
Arterial Line Insertion Start/End1/06/2018 8:10 AM, 08/13/2017 8:15 AM Performed by: Moshe Salisbury, CRNA, CRNA  Patient location: OR. Preanesthetic checklist: patient identified, IV checked, site marked, risks and benefits discussed, surgical consent, monitors and equipment checked, pre-op evaluation, timeout performed and anesthesia consent Patient sedated Left, radial was placed Catheter size: 20 G Hand hygiene performed  Allen's test indicative of satisfactory collateral circulation Attempts: 1 Procedure performed without using ultrasound guided technique. Following insertion, dressing applied and Biopatch. Post procedure assessment: normal  Patient tolerated the procedure well with no immediate complications.

## 2017-08-13 NOTE — Anesthesia Procedure Notes (Signed)
Procedure Name: Intubation Date/Time: 08/13/2017 7:30 AM Performed by: Moshe Salisbury, CRNA Pre-anesthesia Checklist: Patient identified, Emergency Drugs available, Suction available and Patient being monitored Patient Re-evaluated:Patient Re-evaluated prior to induction Oxygen Delivery Method: Circle System Utilized Preoxygenation: Pre-oxygenation with 100% oxygen Induction Type: IV induction Ventilation: Mask ventilation without difficulty Laryngoscope Size: Mac and 3 Grade View: Grade II Tube type: Oral Tube size: 7.5 mm Number of attempts: 1 Airway Equipment and Method: Stylet Placement Confirmation: ETT inserted through vocal cords under direct vision,  positive ETCO2 and breath sounds checked- equal and bilateral Secured at: 20 cm Tube secured with: Tape Dental Injury: Teeth and Oropharynx as per pre-operative assessment

## 2017-08-13 NOTE — Progress Notes (Signed)
Awake, alert, conversant.  MAEW with full strength PF, DF, EHL.  Minimal back pain.  Doing well.

## 2017-08-13 NOTE — Anesthesia Postprocedure Evaluation (Signed)
Anesthesia Post Note  Patient: Destiny Dillon  Procedure(s) Performed: Lumbar four-five, Lumbar five-Sacral one Maximum access posterior lumbar interbody fusion (N/A Back)     Patient location during evaluation: PACU Anesthesia Type: General Level of consciousness: sedated and patient cooperative Pain management: pain level controlled Vital Signs Assessment: post-procedure vital signs reviewed and stable Respiratory status: spontaneous breathing Cardiovascular status: stable Anesthetic complications: no    Last Vitals:  Vitals:   08/13/17 1518 08/13/17 1530  BP: 106/76   Pulse: 73 71  Resp: 11 12  Temp:    SpO2: 98% 98%    Last Pain:  Vitals:   08/13/17 1530  TempSrc:   PainSc: [place]

## 2017-08-13 NOTE — Op Note (Signed)
08/13/2017  11:45 AM  PATIENT:  Destiny Dillon  62 y.o. female  PRE-OPERATIVE DIAGNOSIS:  Spondylolisthesis, Lumbar region, stenosis, herniated lumbar disc, stenosis, radiculopathy, lumbago L 45 and L 5 S 1 levels  POST-OPERATIVE DIAGNOSIS:  Spondylolisthesis, Lumbar region, stenosis, herniated lumbar disc, stenosis, radiculopathy, lumbago L 45 and L 5 S 1 levels  PROCEDURE:  Procedure(s): Lumbar four-five, Lumbar five-Sacral one Maximum access posterior lumbar interbody fusion (N/A) with PEEK cages, pedicle screw fixation, posterolateral arthrodesis L 4 - S 1   Decompression greater than for standard PLIF procedure  SURGEON:  Surgeon(s) and Role:    Erline Levine, MD - Primary    Consuella Lose, MD - Assisting  PHYSICIAN ASSISTANT:   ASSISTANTS: Poteat, RN   ANESTHESIA:   general  EBL:  200 mL   BLOOD ADMINISTERED:none  DRAINS: none   LOCAL MEDICATIONS USED:  MARCAINE    and LIDOCAINE   SPECIMEN:  No Specimen  DISPOSITION OF SPECIMEN:  N/A  COUNTS:  YES  TOURNIQUET:  * No tourniquets in log *  DICTATION: Patient is a 62 year old with spondylolisthesis , stenosis, disc herniation and severe back and bilateral lower extremity pain at L4/5 and L 5 S 1 levels of the lumbar spine. It was elected to take her to surgery for MASPLIF at  L 45 and L 5 S 1  levels with posterolateral arthrodesis.  Procedure:   Following uncomplicated induction of GETA, and placement of electrodes for neural monitoring, patient was turned into a prone position on the [place] tableand using AP  fluoroscopy the area of planned incision was marked, prepped with betadine scrub and Duraprep, then draped. Exposure was performed of facet joint complex at L 45 and L 5 S 1 levels and the MAS retractor was placed.5.5 x 35 mm cortical Nuvasive screws were placed at L 4 bilaterally according to standard landmarks using neural monitoring.  A total laminectomy of L 4 and L 5 was then performed with  disarticulation of facets.  Decompression was greater than for standard PLIF procedure and thorough decompression of the thecal sac, bilateral L 4, L 5, S1 nerve roots was performed. Along with foraminal and extraforaminal portions of these nerve roots.  This bone was saved for grafting,  after being run through bone mill and was placed in bone packing device.  Thorough discectomy was performed bilaterally at L 5 S 1  and the endplates were prepared for grafting.  23 x 8 x 12 degree cages were placed in the interspace and positioning was confirmed with AP and lateral fluoroscopy.  10 cc of autograft was packed in the interspace medial to the second cage.   Thorough discectomy was performed bilaterally at L 45  and the endplates were prepared for grafting.  23 x 10 x 8 degree cages were placed in the interspace and positioning was confirmed with AP and lateral fluoroscopy.  10 cc of autograft was packed in the interspace medial to the second cage.   Remaining screws were placed at L 5 (35 x 5.5 mm) and and S 1 (6.5 x 35 mm) and 60 mm rods were placed. And the screws were locked and torqued.Final Xrays showed well positioned implants and screw fixation. The posterolateral region on the left was packed with remaining 20 cc of autograft and allograft on each side (40 cc total). The wounds were irrigated and then closed with 1, 2-0 and 3-0 Vicryl stitches. 20 cc long-acting Marcaine was injected into the musculature.  Sterile occlusive dressing was placed with Dermabond and an occlusive dressing. The patient was then extubated in the operating room and taken to recovery in stable and satisfactory condition having tolerated her operation well. Counts were correct at the end of the case.  PLAN OF CARE: Admit to inpatient   PATIENT DISPOSITION:  PACU - hemodynamically stable.   Delay start of Pharmacological VTE agent (>24hrs) due to surgical blood loss or risk of bleeding: yes

## 2017-08-13 NOTE — Interval H&P Note (Signed)
History and Physical Interval Note:  08/13/2017 7:16 AM  Destiny Dillon  has presented today for surgery, with the diagnosis of Spondylolisthesis, Lumbar region  The various methods of treatment have been discussed with the patient and family. After consideration of risks, benefits and other options for treatment, the patient has consented to  Procedure(s) with comments: L4-5 L5-S1 Maximum access posterior lumbar interbody fusion (N/A) - L4-5 L5-S1 Maximum access posterior lumbar interbody fusion as a surgical intervention .  The patient's history has been reviewed, patient examined, no change in status, stable for surgery.  I have reviewed the patient's chart and labs.  Questions were answered to the patient's satisfaction.     Meghin Thivierge D

## 2017-08-13 NOTE — Anesthesia Procedure Notes (Signed)
Performed by: Zailyn Rowser R, CRNA       

## 2017-08-13 NOTE — Brief Op Note (Signed)
08/13/2017  11:45 AM  PATIENT:  Destiny Dillon  62 y.o. female  PRE-OPERATIVE DIAGNOSIS:  Spondylolisthesis, Lumbar region, stenosis, herniated lumbar disc, stenosis, radiculopathy, lumbago L 45 and L 5 S 1 levels  POST-OPERATIVE DIAGNOSIS:  Spondylolisthesis, Lumbar region, stenosis, herniated lumbar disc, stenosis, radiculopathy, lumbago L 45 and L 5 S 1 levels  PROCEDURE:  Procedure(s): Lumbar four-five, Lumbar five-Sacral one Maximum access posterior lumbar interbody fusion (N/A) with PEEK cages, pedicle screw fixation, posterolateral arthrodesis L 4 - S 1   Decompression greater than for standard PLIF procedure  SURGEON:  Surgeon(s) and Role:    Erline Levine, MD - Primary    Consuella Lose, MD - Assisting  PHYSICIAN ASSISTANT:   ASSISTANTS: Poteat, RN   ANESTHESIA:   general  EBL:  200 mL   BLOOD ADMINISTERED:none  DRAINS: none   LOCAL MEDICATIONS USED:  MARCAINE    and LIDOCAINE   SPECIMEN:  No Specimen  DISPOSITION OF SPECIMEN:  N/A  COUNTS:  YES  TOURNIQUET:  * No tourniquets in log *  DICTATION: Patient is a 62 year old with spondylolisthesis , stenosis, disc herniation and severe back and bilateral lower extremity pain at L4/5 and L 5 S 1 levels of the lumbar spine. It was elected to take her to surgery for MASPLIF at  L 45 and L 5 S 1  levels with posterolateral arthrodesis.  Procedure:   Following uncomplicated induction of GETA, and placement of electrodes for neural monitoring, patient was turned into a prone position on the [place] tableand using AP  fluoroscopy the area of planned incision was marked, prepped with betadine scrub and Duraprep, then draped. Exposure was performed of facet joint complex at L 45 and L 5 S 1 levels and the MAS retractor was placed.5.5 x 35 mm cortical Nuvasive screws were placed at L 4 bilaterally according to standard landmarks using neural monitoring.  A total laminectomy of L 4 and L 5 was then performed with  disarticulation of facets.  Decompression was greater than for standard PLIF procedure and thorough decompression of the thecal sac, bilateral L 4, L 5, S1 nerve roots was performed. Along with foraminal and extraforaminal portions of these nerve roots.  This bone was saved for grafting,  after being run through bone mill and was placed in bone packing device.  Thorough discectomy was performed bilaterally at L 5 S 1  and the endplates were prepared for grafting.  23 x 8 x 12 degree cages were placed in the interspace and positioning was confirmed with AP and lateral fluoroscopy.  10 cc of autograft was packed in the interspace medial to the second cage.   Thorough discectomy was performed bilaterally at L 45  and the endplates were prepared for grafting.  23 x 10 x 8 degree cages were placed in the interspace and positioning was confirmed with AP and lateral fluoroscopy.  10 cc of autograft was packed in the interspace medial to the second cage.   Remaining screws were placed at L 5 (35 x 5.5 mm) and and S 1 (6.5 x 35 mm) and 60 mm rods were placed. And the screws were locked and torqued.Final Xrays showed well positioned implants and screw fixation. The posterolateral region on the left was packed with remaining 20 cc of autograft and allograft on each side (40 cc total). The wounds were irrigated and then closed with 1, 2-0 and 3-0 Vicryl stitches. 20 cc long-acting Marcaine was injected into the musculature.  Sterile occlusive dressing was placed with Dermabond and an occlusive dressing. The patient was then extubated in the operating room and taken to recovery in stable and satisfactory condition having tolerated her operation well. Counts were correct at the end of the case.  PLAN OF CARE: Admit to inpatient   PATIENT DISPOSITION:  PACU - hemodynamically stable.   Delay start of Pharmacological VTE agent (>24hrs) due to surgical blood loss or risk of bleeding: yes

## 2017-08-13 NOTE — Transfer of Care (Signed)
Immediate Anesthesia Transfer of Care Note  Patient: Destiny Dillon  Procedure(s) Performed: Lumbar four-five, Lumbar five-Sacral one Maximum access posterior lumbar interbody fusion (N/A Back)  Patient Location: PACU  Anesthesia Type:General  Level of Consciousness: awake, oriented and patient cooperative  Airway & Oxygen Therapy: Patient Spontanous Breathing and Patient connected to nasal cannula oxygen  Post-op Assessment: Report given to RN, Post -op Vital signs reviewed and stable and Patient moving all extremities  Post vital signs: Reviewed and stable  Last Vitals:  Vitals:   08/13/17 0601 08/13/17 1149  BP:  (!) 103/53  Pulse: 73 86  Resp:  12  Temp:  (P) 36.8 C  SpO2: 98% 99%    Last Pain:  Vitals:   08/13/17 1149  TempSrc:   PainSc: (P) Asleep      Patients Stated Pain Goal: 5 (97/74/14 2395)  Complications: No apparent anesthesia complications

## 2017-08-14 ENCOUNTER — Other Ambulatory Visit: Payer: Self-pay

## 2017-08-14 MED ORDER — B COMPLEX-C PO TABS
1.0000 | ORAL_TABLET | Freq: Every day | ORAL | Status: DC
  Administered 2017-08-14: 1 via ORAL
  Filled 2017-08-14: qty 1

## 2017-08-14 NOTE — Progress Notes (Signed)
Vitals:   08/13/17 2107 08/14/17 0004 08/14/17 0346 08/14/17 0744  BP: 117/66 105/62 (!) 105/57 115/63  Pulse:  87 79 73  Resp:  16 17 18   Temp:  100.1 F (37.8 C) 99.7 F (37.6 C) 98.2 F (36.8 C)  TempSrc:  Oral Oral Oral  SpO2:  94% 98% 100%  Weight:        Patient resting in bed, comfortable. Excellent relief of preoperative disabling radicular pain. Has ambulated once in the hallways, using a rolling walker. Awaiting PT and OT. Dressing clean and dry. Foley DC'd, and patient voiding well.  Plan: Progressing well through postoperative period. Encouraged to ambulate 4-5 times in the halls today with assistance.  Hosie Spangle, MD 08/14/2017, 8:36 AM

## 2017-08-14 NOTE — Evaluation (Signed)
Physical Therapy Evaluation Patient Details Name: Destiny Dillon MRN: [id number] DOB: 1955/12/31 Today's Date: 08/14/2017   History of Present Illness  Patient is a 62 yo female s/p Lumbar four-five, Lumbar five-Sacral one Maximum access posterior lumbar interbody fusion   Clinical Impression  Patient seen for mobility assessment s/p spinal surgery. Mobilizing well. Educated patient on precautions, mobility expectations, safety and car transfers. No further acute PT needs. Will sign off.     Follow Up Recommendations No PT follow up    Equipment Recommendations  None recommended by PT    Recommendations for Other Services       Precautions / Restrictions Precautions Precautions: Back Precaution Booklet Issued: Yes (comment) Precaution Comments: verbally reviewed  Required Braces or Orthoses: Spinal Brace Spinal Brace: Lumbar corset Restrictions Weight Bearing Restrictions: No      Mobility  Bed Mobility Overal bed mobility: Modified Independent                Transfers Overall transfer level: Modified independent Equipment used: Rolling walker (2 wheeled)             General transfer comment: intial use of RW, no physical assist or cues, increased time to perform  Ambulation/Gait Ambulation/Gait assistance: Supervision Ambulation Distance (Feet): 340 Feet Assistive device: None Gait Pattern/deviations: WFL(Within Functional Limits) Gait velocity: decreased Gait velocity interpretation: Below normal speed for age/gender General Gait Details: VCs for increased cadence, 67ft with RW, 320 without device  Stairs Stairs: Yes Stairs assistance: Supervision Stair Management: One rail Right Number of Stairs: 4 General stair comments: supervision for safety and cues for sequencing  Wheelchair Mobility    Modified Rankin (Stroke Patients Only)       Balance Overall balance assessment: Needs assistance   Sitting balance-Leahy Scale: Good      Standing balance support: During functional activity Standing balance-Leahy Scale: Fair                               Pertinent Vitals/Pain Pain Assessment: Faces Faces Pain Scale: Hurts even more Pain Location: legs and operative sit Pain Descriptors / Indicators: Sore Pain Intervention(s): Monitored during session    Home Living Family/patient expects to be discharged to:: Private residence Living Arrangements: Alone Available Help at Discharge: Family Type of Home: House Home Access: Stairs to enter   Technical brewer of Steps: 4 Home Layout: One level Home Equipment: Environmental consultant - 2 wheels;Cane - single point      Prior Function Level of Independence: Independent               Hand Dominance   Dominant Hand: Right    Extremity/Trunk Assessment   Upper Extremity Assessment Upper Extremity Assessment: Overall WFL for tasks assessed    Lower Extremity Assessment Lower Extremity Assessment: Overall WFL for tasks assessed    Cervical / Trunk Assessment Cervical / Trunk Assessment: (s/p spinal surgery)  Communication      Cognition Arousal/Alertness: Awake/alert Behavior During Therapy: WFL for tasks assessed/performed Overall Cognitive Status: Within Functional Limits for tasks assessed                                        General Comments      Exercises     Assessment/Plan    PT Assessment Patent does not need any further PT services  PT Problem List  PT Treatment Interventions      PT Goals (Current goals can be found in the Care Plan section)  Acute Rehab PT Goals Patient Stated Goal: to go home PT Goal Formulation: With patient/family Time For Goal Achievement: 08/28/17 Potential to Achieve Goals: Good    Frequency     Barriers to discharge        Co-evaluation               AM-PAC PT "6 Clicks" Daily Activity  Outcome Measure Difficulty turning over in bed (including adjusting  bedclothes, sheets and blankets)?: None Difficulty moving from lying on back to sitting on the side of the bed? : None Difficulty sitting down on and standing up from a chair with arms (e.g., wheelchair, bedside commode, etc,.)?: A Little Help needed moving to and from a bed to chair (including a wheelchair)?: A Little Help needed walking in hospital room?: A Little Help needed climbing 3-5 steps with a railing? : A Little 6 Click Score: 20    End of Session Equipment Utilized During Treatment: Back brace Activity Tolerance: Patient tolerated treatment well Patient left: (with OT) Nurse Communication: Mobility status      Time: 9741-6384 PT Time Calculation (min) (ACUTE ONLY): 17 min   Charges:   PT Evaluation $PT Eval Low Complexity: 1 Low     PT G Codes:        Alben Deeds, PT DPT  Board Certified Neurologic Specialist [place] 08/14/2017, 10:26 AM

## 2017-08-14 NOTE — Evaluation (Signed)
Occupational Therapy Evaluation and Discharge Patient Details Name: Destiny Dillon MRN: [id number] DOB: 02-16-1956 Today's Date: 08/14/2017    History of Present Illness Patient is a 62 yo female s/p Lumbar four-five, Lumbar five-Sacral one Maximum access posterior lumbar interbody fusion    Clinical Impression   PTA Pt independent in ADL and mobility. Pt is currently max A for LB ADL, and min guard for standing grooming tasks. Anticipate that Pt will make excellent progress. Back handout provided and reviewed adls in detail. Pt educated on: clothing between brace, never sleep in brace, set an alarm at night for medication, avoid sitting for long periods of time, correct bed positioning for sleeping, correct sequence for bed mobility, AE for LB ADL (grabber/reacher, toilet aide, long handle sponge), avoiding lifting more than 5 pounds and never wash directly over incision. All education is complete and patient/son indicate understanding. NO questions or concerns for OT at the end of session. Thank you for the opportunity to serve this patient. OT To sign off at this time.      Follow Up Recommendations  No OT follow up;Supervision/Assistance - 24 hour(initially)    Equipment Recommendations  3 in 1 bedside commode    Recommendations for Other Services       Precautions / Restrictions Precautions Precautions: Back Precaution Booklet Issued: Yes (comment) Precaution Comments: verbally reviewed  Required Braces or Orthoses: Spinal Brace Spinal Brace: Lumbar corset Restrictions Weight Bearing Restrictions: No      Mobility Bed Mobility Overal bed mobility: Modified Independent             General bed mobility comments: Pt OOB walking with PT when OT joined session, and Pt sat EOB and then transferred to 3 in 1 and recliner  Transfers Overall transfer level: Modified independent Equipment used: None             General transfer comment: transferred from bed, BSC,  and relcliner during session    Balance Overall balance assessment: Needs assistance Sitting-balance support: No upper extremity supported;Feet supported Sitting balance-Leahy Scale: Good     Standing balance support: During functional activity Standing balance-Leahy Scale: Fair                             ADL either performed or assessed with clinical judgement   ADL Overall ADL's : Needs assistance/impaired Eating/Feeding: Modified independent   Grooming: Supervision/safety;Standing;Brushing hair;Wash/dry hands;Wash/dry face Grooming Details (indicate cue type and reason): sink level - educated in compensatory techniques for grooming and maintaining precautions Upper Body Bathing: Moderate assistance;With caregiver independent assisting;Sitting Upper Body Bathing Details (indicate cue type and reason): educated in long handle sponge Lower Body Bathing: Moderate assistance;Sit to/from stand Lower Body Bathing Details (indicate cue type and reason): educated in long handle sponge Upper Body Dressing : Min guard;Sitting Upper Body Dressing Details (indicate cue type and reason): to don/doff brace Lower Body Dressing: Moderate assistance;With caregiver independent assisting;With adaptive equipment;Sit to/from stand Lower Body Dressing Details (indicate cue type and reason): Pt has grabber at home she can use Toilet Transfer: Supervision/safety;Ambulation;Comfort height toilet;Grab bars Toilet Transfer Details (indicate cue type and reason): Pt has low toilet at home, educated in use of 3 in 1 to assist with toilet transfers [place] and Hygiene: Maximal assistance;With caregiver independent assisting;With adaptive equipment;Sit to/from stand Toileting - Clothing Manipulation Details (indicate cue type and reason): educated in use of toilet aide Tub/ Shower Transfer: Min guard;With caregiver independent  assisting;Ambulation;3 in 1 Tub/Shower Transfer  Details (indicate cue type and reason): educated in use of 3 in 1 as shower chair Functional mobility during ADLs: Supervision/safety General ADL Comments: Son present and received all education     Vision Baseline Vision/History: Wears glasses Wears Glasses: At all times Patient Visual Report: No change from baseline Vision Assessment?: No apparent visual deficits     Perception     Praxis      Pertinent Vitals/Pain Pain Assessment: Faces Faces Pain Scale: Hurts even more Pain Location: legs and operative site Pain Descriptors / Indicators: Sore Pain Intervention(s): Monitored during session;Repositioned     Hand Dominance Right   Extremity/Trunk Assessment Upper Extremity Assessment Upper Extremity Assessment: Overall WFL for tasks assessed   Lower Extremity Assessment Lower Extremity Assessment: Overall WFL for tasks assessed   Cervical / Trunk Assessment Cervical / Trunk Assessment: (s/p spinal surgery)   Communication Communication Communication: No difficulties   Cognition Arousal/Alertness: Awake/alert Behavior During Therapy: WFL for tasks assessed/performed Overall Cognitive Status: Within Functional Limits for tasks assessed                                     General Comments  Pt's son present during session, educated in [place] for LB ADL (grabber/reacher, long handle sponge, toilet aide)    Exercises     Shoulder Instructions      Home Living Family/patient expects to be discharged to:: Private residence Living Arrangements: Alone Available Help at Discharge: Family Type of Home: House Home Access: Stairs to enter Technical brewer of Steps: 4   Home Layout: One level     Bathroom Shower/Tub: Occupational psychologist: Standard     Home Equipment: Environmental consultant - 2 wheels;Cane - single point          Prior Functioning/Environment Level of Independence: Independent                 OT Problem List: Decreased  range of motion;Decreased activity tolerance;Impaired balance (sitting and/or standing);Decreased safety awareness;Decreased knowledge of use of DME or AE;Decreased knowledge of precautions;Pain      OT Treatment/Interventions:      OT Goals(Current goals can be found in the care plan section) Acute Rehab OT Goals Patient Stated Goal: to go home OT Goal Formulation: With patient/family Time For Goal Achievement: 08/28/17 Potential to Achieve Goals: Good  OT Frequency:     Barriers to D/C:            Co-evaluation              AM-PAC PT "6 Clicks" Daily Activity     Outcome Measure Help from another person eating meals?: None Help from another person taking care of personal grooming?: A Little Help from another person toileting, which includes using toliet, bedpan, or urinal?: A Lot Help from another person bathing (including washing, rinsing, drying)?: A Lot Help from another person to put on and taking off regular upper body clothing?: A Little Help from another person to put on and taking off regular lower body clothing?: A Lot 6 Click Score: 16   End of Session Equipment Utilized During Treatment: Back brace Nurse Communication: Mobility status  Activity Tolerance: Patient tolerated treatment well Patient left: in chair;with call bell/phone within reach;with family/visitor present  OT Visit Diagnosis: Unsteadiness on feet (R26.81);Other abnormalities of gait and mobility (R26.89);Pain Pain - part of body: (back)  Time: 1020-1046 OT Time Calculation (min): 26 min Charges:  OT General Charges $OT Visit: 1 Visit OT Evaluation $OT Eval Low Complexity: 1 Low OT Treatments $Self Care/Home Management : 8-22 mins G-Codes:     Hulda Humphrey OTR/L [place] 08/14/2017, 11:54 AM

## 2017-08-15 ENCOUNTER — Other Ambulatory Visit: Payer: Self-pay

## 2017-08-15 MED ORDER — METHOCARBAMOL 500 MG PO TABS: 500.0000 mg | ORAL_TABLET | Freq: Four times a day (QID) | ORAL | 2 refills | Status: DC | PRN

## 2017-08-15 MED ORDER — OXYCODONE-ACETAMINOPHEN 5-325 MG PO TABS: 1.0000 | ORAL_TABLET | ORAL | 0 refills | Status: DC | PRN

## 2017-08-15 NOTE — Progress Notes (Signed)
Patient and son given discharge instructions including prescriptions. Questions answered. 3-in-1 DME delivered to room. IV removed. Patient taken to car via wheelchair.

## 2017-08-15 NOTE — Progress Notes (Signed)
CSW attempted to visit with patient prior to her discharging home, patient was in bathroom. Patient is leaving the hospital with her son and will have necessary equipment prior to discharging. No further CSW needs, signing off.  Madilyn Fireman, MSW, LCSW-A Weekend Clinical Social Worker [phone]

## 2017-08-15 NOTE — Care Management (Signed)
Spoke with pt's son and pt will need 3in1.  3 in 1 ordered from [facility] and will be delivered prior to d/c.

## 2017-08-15 NOTE — Discharge Summary (Signed)
Physician Discharge Summary  Patient ID: Destiny Dillon MRN: [id number] DOB/AGE: [birth year] 62 y.o.  Admit date: 08/13/2017 Discharge date: 08/15/2017  Admission Diagnoses:  Spondylolisthesis of lumbar region  Discharge Diagnoses:  Same Active Problems:   Spondylolisthesis of lumbar region   Discharged Condition: Stable  Hospital Course:  Destiny Dillon is a 62 y.o. female who was admitted for the below procedure. There were no post operative complications. At time of discharge, pain was well controlled, ambulating with Pt/OT, tolerating po, voiding normal. Ready for discharge.  Treatments: Surgery Lumbar four-five, Lumbar five-Sacral one Maximum access posterior lumbar interbody fusion (N/A) with PEEK cages, pedicle screw fixation, posterolateral arthrodesis L 4 - S 1    Discharge Exam: Blood pressure (!) 110/59, pulse 60, temperature 98.4 F (36.9 C), temperature source Oral, resp. rate 14, height 5\' 1"  (1.549 m), weight 86.6 kg (191 lb), SpO2 100 %. Awake, alert, oriented Speech fluent, appropriate CN grossly intact MAEW with good strength Wound c/d/i  Disposition: Home  Discharge Instructions    Call MD for:  difficulty breathing, headache or visual disturbances   Complete by:  As directed    Call MD for:  persistant dizziness or light-headedness   Complete by:  As directed    Call MD for:  redness, tenderness, or signs of infection (pain, swelling, redness, odor or green/yellow discharge around incision site)   Complete by:  As directed    Call MD for:  severe uncontrolled pain   Complete by:  As directed    Call MD for:  temperature >100.4   Complete by:  As directed    Diet general   Complete by:  As directed    Driving Restrictions   Complete by:  As directed    Do not drive until given clearance.   Increase activity slowly   Complete by:  As directed    Lifting restrictions   Complete by:  As directed    Do not lift anything >10lbs. Avoid bending  and twisting in awkward positions. Avoid bending at the back.   May shower / Bathe   Complete by:  As directed    In 24 hours. Okay to wash wound with warm soapy water. Avoid scrubbing the wound. Pat dry.   Remove dressing in 24 hours   Complete by:  As directed      Allergies as of 08/15/2017      Reactions   Codeine Other (See Comments)   hallucinations    Erythromycin Nausea And Vomiting      Medication List    STOP taking these medications   HYDROcodone-acetaminophen 7.5-325 MG tablet Commonly known as:  NORCO     TAKE these medications   acetaminophen 500 MG tablet Commonly known as:  TYLENOL Take 1,000 mg by mouth every 8 (eight) hours as needed for mild pain.   acyclovir 400 MG tablet Commonly known as:  ZOVIRAX Take 400 mg by mouth 2 (two) times daily as needed (as needed for fever blisters).   aspirin EC 81 MG tablet Take 81 mg by mouth daily.   atorvastatin 40 MG tablet Commonly known as:  LIPITOR Take 40 mg by mouth daily at 6 PM.   b complex vitamins tablet Take 1 tablet by mouth daily.   CAL MAG ZINC +D3 PO Take 1 tablet by mouth daily.   D 5000 5000 units capsule Generic drug:  Cholecalciferol Take 5,000 Units by mouth daily.   diphenhydrAMINE 25 MG tablet Commonly known as:  BENADRYL Take 25 mg by mouth at bedtime.   Fish Oil 1200 MG Caps Take 2,400 mg by mouth daily.   methocarbamol 500 MG tablet Commonly known as:  ROBAXIN Take 1 tablet (500 mg total) by mouth every 6 (six) hours as needed for muscle spasms.   metoprolol succinate 50 MG 24 hr tablet Commonly known as:  TOPROL-XL TAKE 1 TABLET (50 MG TOTAL) BY MOUTH DAILY.   niacin 500 MG CR tablet Commonly known as:  NIASPAN Take 500 mg by mouth at bedtime.   omeprazole 20 MG capsule Commonly known as:  PRILOSEC Take 20 mg by mouth every evening.   oxyCODONE-acetaminophen 5-325 MG tablet Commonly known as:  ROXICET Take 1 tablet by mouth every 4 (four) hours as needed for severe  pain.   PROBIOTIC PO Take 1 tablet by mouth daily.   ramipril 10 MG capsule Commonly known as:  ALTACE Take 10 mg by mouth every evening.   sertraline 50 MG tablet Commonly known as:  ZOLOFT Take 50 mg by mouth at bedtime.   vitamin C 500 MG tablet Commonly known as:  ASCORBIC ACID Take 500 mg by mouth daily.   vitamin E 400 UNIT capsule Take 400 Units by mouth daily.            Durable Medical Equipment  (From admission, onward)        Start     Ordered   08/15/17 0835  DME 3-in-1  Once     08/15/17 3267     Follow-up Information    Erline Levine, MD Follow up.   Specialty:  Neurosurgery Contact information: [address] [place] [address] [phone]           Signed: Traci Sermon 08/15/2017, 8:34 AM

## 2017-08-16 MED FILL — Heparin Sodium (Porcine) Inj 1000 Unit/ML: INTRAMUSCULAR | Qty: 30 | Status: AC

## 2017-08-16 MED FILL — Sodium Chloride IV Soln 0.9%: INTRAVENOUS | Qty: 1000 | Status: AC

## 2017-08-17 ENCOUNTER — Encounter (HOSPITAL_COMMUNITY): Payer: Self-pay | Admitting: Neurosurgery

## 2019-01-03 ENCOUNTER — Other Ambulatory Visit: Payer: Self-pay | Admitting: Neurosurgery

## 2019-01-03 DIAGNOSIS — M5126 Other intervertebral disc displacement, lumbar region: Secondary | ICD-10-CM

## 2019-01-30 ENCOUNTER — Other Ambulatory Visit: Payer: Self-pay

## 2019-01-30 ENCOUNTER — Ambulatory Visit
Admission: RE | Admit: 2019-01-30 | Discharge: 2019-01-30 | Disposition: A | Discharge status: Home / Self Care | Payer: Medicare Other | Source: Ambulatory Visit | Attending: Neurosurgery | Admitting: Neurosurgery

## 2019-01-30 DIAGNOSIS — M5126 Other intervertebral disc displacement, lumbar region: Secondary | ICD-10-CM

## 2019-01-30 MED ORDER — GADOBENATE DIMEGLUMINE 529 MG/ML IV SOLN
18.0000 mL | Freq: Once | INTRAVENOUS | Status: AC | PRN
  Administered 2019-01-30: 18 mL via INTRAVENOUS

## 2020-06-06 ENCOUNTER — Other Ambulatory Visit: Payer: Self-pay | Admitting: Neurosurgery

## 2020-06-06 DIAGNOSIS — M5416 Radiculopathy, lumbar region: Secondary | ICD-10-CM

## 2020-06-26 ENCOUNTER — Other Ambulatory Visit: Payer: Self-pay

## 2020-07-03 ENCOUNTER — Other Ambulatory Visit: Payer: Self-pay

## 2020-07-03 ENCOUNTER — Ambulatory Visit
Admission: RE | Admit: 2020-07-03 | Discharge: 2020-07-03 | Disposition: A | Discharge status: Home / Self Care | Payer: Medicare Other | Source: Ambulatory Visit | Attending: Neurosurgery | Admitting: Neurosurgery

## 2020-07-03 DIAGNOSIS — M5416 Radiculopathy, lumbar region: Secondary | ICD-10-CM

## 2020-07-03 MED ORDER — GADOBENATE DIMEGLUMINE 529 MG/ML IV SOLN
18.0000 mL | Freq: Once | INTRAVENOUS | Status: AC | PRN
  Administered 2020-07-03: 18 mL via INTRAVENOUS

## 2020-07-08 ENCOUNTER — Other Ambulatory Visit: Payer: Self-pay | Admitting: Neurosurgery

## 2020-08-06 NOTE — H&P (Signed)
Patient ID:   [id number] Patient: Destiny Dillon  Date of Birth: 05/10/1956 Visit Type: Office Visit   Date: 07/08/2020 10:15 AM Provider: Marchia Meiers. Vertell Limber MD   This 65 year old female presents for MRI Review.  HISTORY OF PRESENT ILLNESS: 1.  MRI Review  Patient returns to review her MRI  The patient is complaining of right greater than left leg pain.  She says the pain goes away when she lays down but that it is is sore when she is sitting and way worse when she is upright.  She says both legs bother her.  She has been using gabapentin but says this does not help her a lot.  I have encouraged her to pursue water aerobics and weight loss as we have previously discussed.   The patient's MRI demonstrates prior fusion L4-5 and L5-S1 levels with progression of severe spinal stenosis at the L3-4 level.  Based on the patient's significant pain complaints and her imaging studies, I have recommended XL IF L3-4 level with lateral plate.      Medical/Surgical/Interim History Reviewed, no change.  Last detailed document date:06/07/2017.     PAST MEDICAL HISTORY, SURGICAL HISTORY, FAMILY HISTORY, SOCIAL HISTORY AND REVIEW OF SYSTEMS I have reviewed the patient's past medical, surgical, family and social history as well as the comprehensive review of systems as included on the [facility] history form dated 03/08/2019, which I have signed.  Family History: Reviewed, no changes.  Last detailed document date:06/07/2017.   Social History: Reviewed, no changes. Last detailed document date: 06/07/2017.    MEDICATIONS: (added, continued or stopped this visit) Started Medication Directions Instruction Stopped  atorvastatin 40 mg tablet take 1 tablet by oral route  every day   05/20/2020 hydrocodone 7.5 mg-acetaminophen 325 mg tablet take 1 tablet by oral route  every 12 hours as needed for pain  07/08/2020 07/08/2020 hydrocodone 7.5  mg-acetaminophen 325 mg tablet take 1 tablet by oral route  every 12 hours as needed for pain    metoprolol tartrate 50 mg tablet take 1 tablet by oral route 2 times every day with meals   05/27/2020 Neurontin 300 mg capsule take 1 capsule by oral route 3 times every day    ramipril 10 mg capsule take 1 capsule by oral route  every day    sertraline 50 mg tablet take 1 tablet by oral route  every day   06/03/2020 Valium 10 mg tablet Take one by oral route pior to MRI      ALLERGIES: Ingredient Reaction Medication Name Comment CODEINE Lightheadedness   ERYTHROMYCIN BASE Lightheadedness; Vomiting    Reviewed, no changes.    PHYSICAL EXAM:  Vitals Date Temp F BP Pulse Ht In Wt Lb BMI BSA Pain Score 07/08/2020  127/80 73 61 198 37.41  7/10     IMPRESSION:  The patient is complaining of bilateral lower extremity pain and weakness.  Her imaging shows severe L3-4 stenosis with healed L4-5 and L5-S1 fusion.  PLAN: Proceed with XL IF L3-4 with lateral plate.  Risks and benefits were discussed in detail with the patient and she wishes to proceed with surgery.  Patient education was performed at length.  Orders: Diagnostic Procedures: Assessment Procedure M54.16 Lumbar Spine- AP/Lat Instruction(s)/Education: Assessment Instruction I10 Lifestyle education [phone] Dietary management education, guidance, and counseling Miscellaneous: Assessment  M43.16 LSO Brace  Completed Orders (this encounter) Order Details Reason Side Interpretation Result Initial Treatment Date Region Lifestyle education Patient will follow up with  Primary Care Physician.       Dietary management education, guidance, and counseling Encouraged patient to eat well balanced diet.        Assessment/Plan  # Detail Type Description  1. Assessment Degenerative lumbar spinal stenosis (M48.061).     2. Assessment Lumbago with  sciatica, right side (M54.41).     3. Assessment Lumbar radiculopathy (M54.16).     4. Assessment Spondylolisthesis, lumbar region (M43.16).  Plan Orders LSO Brace.     5. Assessment Essential (primary) hypertension (I10).     6. Assessment Body mass index (BMI) 37.0-37.9, adult (Z68.37).  Plan Orders Today's instructions / counseling include(s) Dietary management education, guidance, and counseling. Clinical information/comments: Encouraged patient to eat well balanced diet.       Pain Management Plan Pain Scale: 7/10. Method: Numeric Pain Intensity Scale. Location: back. Onset: 06/07/2014. Duration: varies. Quality: discomforting. Pain management follow-up plan of care: Patient will continue medication management.Marland Kitchen     MEDICATIONS PRESCRIBED TODAY    Rx Quantity Refills HYDROCODONE-ACETAMINOPHEN 7.5 mg-325 mg  60 0           Provider:  Marchia Meiers. Vertell Limber MD  07/11/2020 01:17 PM    Dictation edited by: Marchia Meiers. Vertell Limber    CC Providers: [place]  [phone]   [facility] [address] [place] [address]-               Electronically signed by Marchia Meiers. Vertell Limber MD on 07/11/2020 01:17 PM Patient ID:   [id number] Patient: Destiny Dillon  Date of Birth: 01-03-1956 Visit Type: Office Visit   Date: 06/03/2020 08:30 AM Provider: Marchia Meiers. Vertell Limber MD   This 65 year old female presents for back pain.  HISTORY OF PRESENT ILLNESS: 1.  back pain  Pt returns as scheduled.  She reports constant mild ache in both lower legs (R>L) and some back pain with activity.  She found no benefit to PT and stopped after 20 visits.  Gabapentin offers significant relief for leg symptoms; and Norco once daily relieves lumbar pain due to activity.  Gabapentin 300mg  tid Norco 7.5/325 1/day  The patient feels that she is not making a lot of progress and back said she is  starting as if she is getting worse.  She notes aching into both of her calves, right worse than left.  She says the gabapentin does not cause her any side effects and I suggested that she try increasing gabapentin 1200 mg daily.  She has initially going to add in addition [place] if this works I will change her to 400 mg 3 daily.  The patient says she is pain into her legs that is better than before with no longer having spasms and does feel that the gabapentin as help calm situation down  She is describing pain at 6-7 out of in severity.  Her strength is full on confrontation testing both lower extremities.  At this point I have recommended that we be imaged her lumbar spine.  I have recommended four view lumbar radiographs and also an MRI lumbar spine with and without gadolinium  She requests Valium for sedation prior to the MRI sent this to her MRI and I have sent this to her pharmacy  The patient says she got a full once but says it was "nasty"in she did not want to go back  She says that physical therapy is reveal worse rather than better.  She has not lost any weight.      Medical/Surgical/Interim History Reviewed, no change.  Last detailed document date:06/07/2017.     PAST MEDICAL HISTORY, SURGICAL HISTORY, FAMILY HISTORY, SOCIAL HISTORY AND REVIEW OF SYSTEMS I have reviewed the patient's past medical, surgical, family and social history as well as the comprehensive review of systems as included on the [facility] history form dated 03/08/2019, which I have signed.  Family History: Reviewed, no changes.  Last detailed document date:06/07/2017.   Social History: Reviewed, no changes. Last detailed document date: 06/07/2017.    MEDICATIONS: (added, continued or stopped this visit) Started Medication Directions Instruction Stopped  atorvastatin 40 mg tablet take 1 tablet by oral route  every day   05/20/2020 hydrocodone 7.5  mg-acetaminophen 325 mg tablet take 1 tablet by oral route  every 12 hours as needed for pain    metoprolol tartrate 50 mg tablet take 1 tablet by oral route 2 times every day with meals   05/27/2020 Neurontin 300 mg capsule take 1 capsule by oral route 3 times every day    ramipril 10 mg capsule take 1 capsule by oral route  every day    sertraline 50 mg tablet take 1 tablet by oral route  every day   06/03/2020 Valium 10 mg tablet Take one by oral route pior to MRI      ALLERGIES: Ingredient Reaction Medication Name Comment CODEINE Lightheadedness   [place]; Vomiting    Reviewed, no changes.    PHYSICAL EXAM:  Vitals Date Temp F BP Pulse Ht In Wt Lb BMI BSA Pain Score 06/03/2020  125/77 69 61 199.8 37.75  7/10     IMPRESSION:  Persistent bilateral lower extremity pain.  I have increased gabapentin dosage and I am going to reimage her lumbar spine  PLAN: Follow-up after repeat imaging of lumbar spine  Orders: Diagnostic Procedures: Assessment Procedure M54.16 MRI Spine/lumb With & W/o Contrast M54.42 Lumbar Spine- AP/Lat/Flex/Ex Instruction(s)/Education: Assessment Instruction I10 Hypertension education [phone] Lifestyle education regarding diet  Completed Orders (this encounter) Order Details Reason Side Interpretation Result Initial Treatment Date Region Lifestyle education regarding diet Encouraged patient to eat well balanced diet.       Hypertension education Patient will follow-up with primary care physician.        Assessment/Plan  # Detail Type Description  1. Assessment Lumbar radiculopathy (M54.16).     2. 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     3. Assessment Lumbago with sciatica, right side (M54.41).     4. Assessment Other chronic pain (G89.29).     5. Assessment Spondylolisthesis,  lumbar region (M43.16).     6. Assessment Body mass index (BMI) 37.0-37.9, adult (Z68.37).  Plan Orders Today's instructions / counseling include(s) Lifestyle education regarding diet. Clinical information/comments: Encouraged patient to eat well balanced diet.     7. Assessment Essential (primary) hypertension (I10).       Pain Management Plan Pain Scale: 7/10. Method: Numeric Pain Intensity Scale. Location: back. Onset: 06/07/2014. Duration: varies. Quality: discomforting. Pain management follow-up plan of care: Patient will continue medication management.Marland Kitchen     MEDICATIONS PRESCRIBED TODAY    Rx Quantity Refills VALIUM 10 mg  1 0           Provider:  Marchia Meiers. Vertell Limber MD  06/03/2020 09:01 AM    Dictation edited by: Marchia Meiers. Vertell Limber    CC Providers: Pat Kocher [address] [place]  [phone]   Collene Leyden  [facility] [address] [place], [place] [address]-               Electronically signed by Danae Orleans Venetia Maxon MD on 06/03/2020 09:01 AM

## 2020-08-16 ENCOUNTER — Inpatient Hospital Stay (HOSPITAL_COMMUNITY): Admission: RE | Admit: 2020-08-16 | Payer: BLUE CROSS/BLUE SHIELD | Source: Ambulatory Visit

## 2020-08-16 ENCOUNTER — Other Ambulatory Visit (HOSPITAL_COMMUNITY): Payer: BLUE CROSS/BLUE SHIELD

## 2020-09-05 NOTE — Progress Notes (Signed)
Surgical Instructions    Your procedure is scheduled on February 8  Report to [facility] Main Entrance "A" at 0730 A.M., then check in with the Admitting office.  Call this number if you have problems the morning of surgery:  [phone]   If you have any questions prior to your surgery date call [phone]: Open Monday-Friday 8am-4pm    Remember:  Do not eat or drink after midnight the night before your surgery    Take these medicines the morning of surgery with A SIP OF WATER  acetaminophen (TYLENOL)  If needed for pain acyclovir (ZOVIRAX if needed cetirizine (ZYRTEC)  HYDROcodone-acetaminophen (NORCO) metoprolol succinate (TOPROL-XL)  Follow your surgeon's instructions on when to stop Aspirin.  If no instructions were given by your surgeon then you will need to call the office to get those instructions.    As of today, STOP taking any Aleve, Naproxen, Ibuprofen, Motrin, Advil, Goody's, BC's, all herbal medications, fish oil, and all vitamins.                     Do not wear jewelry, make up, or nail polish            Do not wear lotions, powders, perfumes/colognes, or deodorant.            Do not shave 48 hours prior to surgery.              Do not bring valuables to the hospital.            [facility] is not responsible for any belongings or valuables.  Do NOT Smoke (Tobacco/Vaping) or drink Alcohol 24 hours prior to your procedure If you use a CPAP at night, you may bring all equipment for your overnight stay.   Contacts, glasses, dentures or bridgework may not be worn into surgery, please bring cases for these belongings   For patients admitted to the hospital, discharge time will be determined by your treatment team.   Patients discharged the day of surgery will not be allowed to drive home, and someone needs to stay with them for 24 hours.    Special instructions:   [facility]- Preparing For Surgery  Before surgery, you can play an important role. Because  skin is not sterile, your skin needs to be as free of germs as possible. You can reduce the number of germs on your skin by washing with CHG (chlorahexidine gluconate) Soap before surgery.  CHG is an antiseptic cleaner which kills germs and bonds with the skin to continue killing germs even after washing.    Oral Hygiene is also important to reduce your risk of infection.  Remember - BRUSH YOUR TEETH THE MORNING OF SURGERY WITH YOUR REGULAR TOOTHPASTE  Please do not use if you have an allergy to CHG or antibacterial soaps. If your skin becomes reddened/irritated stop using the CHG.  Do not shave (including legs and underarms) for at least 48 hours prior to first CHG shower. It is OK to shave your face.  Please follow these instructions carefully.   1. If you chose to wash your hair, wash your hair first as usual with your normal shampoo.  2. After you shampoo, rinse your hair and body thoroughly to remove the shampoo.  3. Wash Face and genitals (private parts) with your normal soap.   4. THEN Shower the NIGHT BEFORE SURGERY and the MORNING OF SURGERY with CHG Soap.   5. Use CHG as you would  any other liquid soap. You can apply CHG directly to the skin and wash gently with a scrungie or a clean washcloth.   6. Apply the CHG Soap to your body ONLY FROM THE NECK DOWN.  Do not use on open wounds or open sores. Avoid contact with your eyes, ears, mouth and genitals (private parts). Wash Face and genitals (private parts)  with your normal soap.   7. Wash thoroughly, paying special attention to the area where your surgery will be performed.  8. Thoroughly rinse your body with warm water from the neck down.  9. DO NOT shower/wash with your normal soap after using and rinsing off the CHG Soap.  10. Pat yourself dry with a CLEAN TOWEL.  11. Wear CLEAN PAJAMAS to bed the night before surgery  12. Place CLEAN SHEETS on your bed the night before your surgery  13. DO NOT SLEEP WITH PETS.   Day  of Surgery: Wear Clean/Comfortable clothing the morning of surgery Do not apply any deodorants/lotions.   Remember to brush your teeth WITH YOUR REGULAR TOOTHPASTE.   Please read over the following fact sheets that you were given.

## 2020-09-06 ENCOUNTER — Encounter (HOSPITAL_COMMUNITY): Payer: Self-pay

## 2020-09-06 ENCOUNTER — Encounter (HOSPITAL_COMMUNITY)
Admission: RE | Admit: 2020-09-06 | Discharge: 2020-09-06 | Disposition: A | Discharge status: Home / Self Care | Payer: Medicare Other | Source: Ambulatory Visit | Attending: Neurosurgery | Admitting: Neurosurgery

## 2020-09-06 ENCOUNTER — Other Ambulatory Visit: Payer: Self-pay

## 2020-09-06 ENCOUNTER — Other Ambulatory Visit (HOSPITAL_COMMUNITY)
Admission: RE | Admit: 2020-09-06 | Discharge: 2020-09-06 | Disposition: A | Discharge status: Home / Self Care | Payer: Medicare Other | Source: Ambulatory Visit | Attending: Neurosurgery | Admitting: Neurosurgery

## 2020-09-06 DIAGNOSIS — Z01812 Encounter for preprocedural laboratory examination: Secondary | ICD-10-CM | POA: Insufficient documentation

## 2020-09-06 DIAGNOSIS — Z20822 Contact with and (suspected) exposure to covid-19: Secondary | ICD-10-CM | POA: Diagnosis not present

## 2020-09-06 DIAGNOSIS — Z01818 Encounter for other preprocedural examination: Secondary | ICD-10-CM | POA: Insufficient documentation

## 2020-09-06 DIAGNOSIS — I519 Heart disease, unspecified: Secondary | ICD-10-CM | POA: Diagnosis not present

## 2020-09-06 HISTORY — DX: Malignant (primary) neoplasm, unspecified: C80.1

## 2020-09-06 LAB — CBC
HCT: 44.4 % (ref 36.0–46.0)
Hemoglobin: 14.4 g/dL (ref 12.0–15.0)
MCH: 33 pg (ref 26.0–34.0)
MCHC: 32.4 g/dL (ref 30.0–36.0)
MCV: 101.8 fL — ABNORMAL HIGH (ref 80.0–100.0)
Platelets: 278 10*3/uL (ref 150–400)
RBC: 4.36 MIL/uL (ref 3.87–5.11)
RDW: 11.5 % (ref 11.5–15.5)
WBC: 8.3 10*3/uL (ref 4.0–10.5)
nRBC: 0 % (ref 0.0–0.2)

## 2020-09-06 LAB — SURGICAL PCR SCREEN
MRSA, PCR: NEGATIVE
Staphylococcus aureus: NEGATIVE

## 2020-09-06 LAB — TYPE AND SCREEN
ABO/RH(D): O POS
Antibody Screen: NEGATIVE

## 2020-09-06 NOTE — Progress Notes (Signed)
PCP: Dr. Pat Kocher in [place], [place] Cardiologist: Dr. Royetta Crochet in [place], [place]  EKG: Today CXR: n/a ECHO: 06/29/17 Care Everywhere Stress Test: 06/29/17 Care Everywhere Cardiac Cath: Jan 2010--Under Media Tab, Filed under Correspondence  Pt had recent CMET done at PCP office on Aug 29, 2020. No need to repeat today, CBC and T&S obtained.  Scheduled to Covid testing today. Aware to quarantine afterwards.  Patient denies shortness of breath, fever, cough, and chest pain at PAT appointment.  Patient verbalized understanding of instructions provided today at the PAT appointment.  Patient asked to review instructions at home and day of surgery.   Chart forwarded to anesthesia.

## 2020-09-07 LAB — SARS CORONAVIRUS 2 (TAT 6-24 HRS): SARS Coronavirus 2: NEGATIVE

## 2020-09-09 NOTE — Anesthesia Preprocedure Evaluation (Addendum)
Anesthesia Evaluation  Patient identified by MRN, date of birth, ID band Patient awake    Reviewed: Allergy & Precautions, NPO status , Patient's Chart, lab work & pertinent test results  Airway Mallampati: II  TM Distance: >3 FB Neck ROM: Full    Dental  (+) Dental Advisory Given, Chipped, Teeth Intact,    Pulmonary neg pulmonary ROS,    Pulmonary exam normal breath sounds clear to auscultation       Cardiovascular + CAD, + Past MI and + Cardiac Stents  Normal cardiovascular exam Rhythm:Regular Rate:Normal     Neuro/Psych negative neurological ROS     GI/Hepatic Neg liver ROS, GERD  ,  Endo/Other  negative endocrine ROS  Renal/GU negative Renal ROS     Musculoskeletal  (+) Arthritis ,   Abdominal (+) + obese,   Peds  Hematology negative hematology ROS (+)   Anesthesia Other Findings   Reproductive/Obstetrics                                                             Anesthesia Evaluation  Patient identified by MRN, date of birth, ID band Patient awake    Reviewed: Allergy & Precautions, NPO status , Patient's Chart, lab work & pertinent test results  Airway Mallampati: II  TM Distance: >3 FB Neck ROM: Full    Dental no notable dental hx.    Pulmonary neg pulmonary ROS,    Pulmonary exam normal breath sounds clear to auscultation       Cardiovascular + CAD and + Past MI  Normal cardiovascular exam Rhythm:Regular Rate:Normal     Neuro/Psych  Neuromuscular disease negative psych ROS   GI/Hepatic Neg liver ROS, GERD  ,  Endo/Other  negative endocrine ROS  Renal/GU negative Renal ROS     Musculoskeletal negative musculoskeletal ROS (+)   Abdominal   Peds  Hematology negative hematology ROS (+)   Anesthesia Other Findings   Reproductive/Obstetrics negative OB ROS                             Anesthesia Physical Anesthesia  Plan  ASA: III  Anesthesia Plan: General   Post-op Pain Management:    Induction: Intravenous  PONV Risk Score and Plan: 4 or greater and Dexamethasone, Ondansetron, Midazolam and Scopolamine patch - Pre-op  Airway Management Planned: Oral ETT  Additional Equipment: Arterial line  Intra-op Plan:   Post-operative Plan: Extubation in OR  Informed Consent: I have reviewed the patients History and Physical, chart, labs and discussed the procedure including the risks, benefits and alternatives for the proposed anesthesia with the patient or authorized representative who has indicated his/her understanding and acceptance.   Dental advisory given  Plan Discussed with: CRNA  Anesthesia Plan Comments:        Anesthesia Quick Evaluation  Anesthesia Physical Anesthesia Plan  ASA: III  Anesthesia Plan: General   Post-op Pain Management:    Induction: Intravenous  PONV Risk Score and Plan: 4 or greater and Ondansetron, Dexamethasone, Midazolam, Treatment may vary due to age or medical condition and Diphenhydramine  Airway Management Planned: Oral ETT  Additional Equipment:   Intra-op Plan:   Post-operative Plan: Extubation in OR  Informed Consent: I have reviewed the patients History and Physical,  chart, labs and discussed the procedure including the risks, benefits and alternatives for the proposed anesthesia with the patient or authorized representative who has indicated his/her understanding and acceptance.     Dental advisory given  Plan Discussed with: CRNA  Anesthesia Plan Comments: (PAT note by Karoline Caldwell, PA-C: Follows with cardiology at [facility] for hx of CAD s/p non-ST elevation myocardial infarction 2010. PCI with drug-eluting stent to the right coronary artery February 2010. Repeat percutaneous coronary intervention with drug-eluting stent to the right coronary artery for in-stent restenosis July 2010. Last seen 06/04/20 by Dr. Haig Prophet. Per note, "Mrs.  Correia is doing well from a cardiac standpoint and I could not be more pleased. I reviewed her recent laboratory studies with her. Her lipids are excellent. Her blood pressure is very well controlled. I encouraged her to continue to take her current medications. I told her I was very pleased with her clinical course. She is now 11 years post percutaneous coronary intervention with no evidence of recurrent ischemia. I will plan to see her in follow-up in 1 year."  Cardiac clearance dated 07/24/2021 states patient is low risk for surgery.  Recommends not holding aspirin.  Copy of clearance on chart.  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  Preop labs reviewed, unremarkable.  CMP was performed by PCP on 08/29/2020, copy of results on chart.  EKG 09/06/2020: Sinus bradycardia.  Rate 55.  Stress echo 06/29/2017 (Care Everywhere): Summary:  1. Normal exercise echocardiogram with no exercise-induced left  ventricular wall motion abnormalities at 91% of the maximal,  age-predicted target heart rate.  2. The left ventricularchamber size is normal.  3. Global left ventricular wall motion and contractility are within  normal limits.  4. The EF is estimated at 60-65%.  5. Normal left ventricular diastolic filling is observed.  6. The right ventricular chamber size and systolic function are within  normal limits.  7. The left atrium is normal in size with no visual thrombus  identified.  8. The right atrium appears normal.  9. The aortic valve is trileaflet. The leaflets are thin with normal  excursion. There is no aortic stenosis or regurgitation.  10. The mitral valve appears normal in structure and function. There is  no mitral stenosis or regurgitation.  11. There is trivial tricuspid regurgitation.  12. The pulmonic valve appears normal in structure and function. There  is no pulmonic stenosis or regurgitation present.  13. The pericardium appears normal.   14. The aortic root appears normal.  15. The inferior vena cava is normal in size with >50% inspiratory  collapse, suggestive of a normal right atrial pressure (38mmHg).  16. No evidence of thrombus, intra-cardiac shunting or vegetation.  17. Patient followed a Bruce protocol.  18. The patient exercised into stage 3.  19. The total exercise duration was 6 and half minutes.  20. The study was terminated because of fatigue.  21. The patient did not express feelings of chest discomfort.  22. ST segment response to stress was normal.  23. Exercise capacity is fair.  24. The patient achieved a level of 7 METS.  25. There were rare ventricular premature beats.  26. Thisis a negative electrocardiographic stress test.  27. This was a negative echocardiographic stress test.  28. The above results were discussed with the patient.   )      Anesthesia Quick Evaluation

## 2020-09-09 NOTE — Progress Notes (Signed)
Anesthesia Chart Review:  Follows with cardiology at [facility] for hx of CAD s/p non-ST elevation myocardial infarction 2010. PCI with drug-eluting stent to the right coronary artery February 2010. Repeat percutaneous coronary intervention with drug-eluting stent to the right coronary artery for in-stent restenosis July 2010. Last seen 06/04/20 by Dr. Haig Prophet. Per note, "Destiny Dillon is doing well from a cardiac standpoint and I could not be more pleased. I reviewed her recent laboratory studies with her. Her lipids are excellent. Her blood pressure is very well controlled. I encouraged her to continue to take her current medications. I told her I was very pleased with her clinical course. She is now 11 years post percutaneous coronary intervention with no evidence of recurrent ischemia. I will plan to see her in follow-up in 1 year."  Cardiac clearance dated 07/24/2021 states patient is low risk for surgery.  Recommends not holding aspirin.  Copy of clearance on chart.  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  Preop labs reviewed, unremarkable.  CMP was performed by PCP on 08/29/2020, copy of results on chart.  EKG 09/06/2020: Sinus bradycardia.  Rate 55.  Stress echo 06/29/2017 (Care Everywhere): Summary:  1. Normal exercise echocardiogram with no exercise-induced left  ventricular wall motion abnormalities at 91% of the maximal,  age-predicted target heart rate.  2. The left ventricularchamber size is normal.  3. Global left ventricular wall motion and contractility are within  normal limits.  4. The EF is estimated at 60-65%.  5. Normal left ventricular diastolic filling is observed.  6. The right ventricular chamber size and systolic function are within  normal limits.  7. The left atrium is normal in size with no visual thrombus  identified.  8. The right atrium appears normal.  9. The aortic valve is trileaflet. The leaflets are thin with normal   excursion. There is no aortic stenosis or regurgitation.  10. The mitral valve appears normal in structure and function. There is  no mitral stenosis or regurgitation.  11. There is trivial tricuspid regurgitation.  12. The pulmonic valve appears normal in structure and function. There  is no pulmonic stenosis or regurgitation present.  13. The pericardium appears normal.  14. The aortic root appears normal.  15. The inferior vena cava is normal in size with >50% inspiratory  collapse, suggestive of a normal right atrial pressure (67mmHg).  16. No evidence of thrombus, intra-cardiac shunting or vegetation.  17. Patient followed a Bruce protocol.  18. The patient exercised into stage 3.  19. The total exercise duration was 6 and half minutes.  20. The study was terminated because of fatigue.  21. The patient did not express feelings of chest discomfort.  22. ST segment response to stress was normal.  23. Exercise capacity is fair.  24. The patient achieved a level of 7 METS.  25. There were rare ventricular premature beats.  26. Thisis a negative electrocardiographic stress test.  27. This was a negative echocardiographic stress test.  28. The above results were discussed with the patient.     Wynonia Musty [facility]/Anesthesiology Phone [phone] 09/09/2020 10:41 AM

## 2020-09-10 ENCOUNTER — Inpatient Hospital Stay (HOSPITAL_COMMUNITY): Payer: Medicare Other | Admitting: Physician Assistant

## 2020-09-10 ENCOUNTER — Inpatient Hospital Stay (HOSPITAL_COMMUNITY): Payer: Medicare Other | Admitting: Anesthesiology

## 2020-09-10 ENCOUNTER — Encounter (HOSPITAL_COMMUNITY): Payer: Self-pay | Admitting: Neurosurgery

## 2020-09-10 ENCOUNTER — Encounter (HOSPITAL_COMMUNITY)
Admission: RE | Disposition: A | Discharge status: Home / Self Care | Payer: Self-pay | Source: Home / Self Care | Attending: Neurosurgery

## 2020-09-10 ENCOUNTER — Other Ambulatory Visit: Payer: Self-pay

## 2020-09-10 ENCOUNTER — Inpatient Hospital Stay (HOSPITAL_COMMUNITY)
Admission: RE | Admit: 2020-09-10 | Discharge: 2020-09-11 | DRG: 460 | Disposition: A | Discharge status: Home / Self Care | Payer: Medicare Other | Attending: Neurosurgery | Admitting: Neurosurgery

## 2020-09-10 ENCOUNTER — Inpatient Hospital Stay (HOSPITAL_COMMUNITY): Payer: Medicare Other

## 2020-09-10 DIAGNOSIS — M48061 Spinal stenosis, lumbar region without neurogenic claudication: Secondary | ICD-10-CM | POA: Diagnosis present

## 2020-09-10 DIAGNOSIS — E669 Obesity, unspecified: Secondary | ICD-10-CM | POA: Diagnosis present

## 2020-09-10 DIAGNOSIS — Z419 Encounter for procedure for purposes other than remedying health state, unspecified: Secondary | ICD-10-CM

## 2020-09-10 DIAGNOSIS — Z79899 Other long term (current) drug therapy: Secondary | ICD-10-CM

## 2020-09-10 DIAGNOSIS — Z885 Allergy status to narcotic agent status: Secondary | ICD-10-CM | POA: Diagnosis not present

## 2020-09-10 DIAGNOSIS — Z881 Allergy status to other antibiotic agents status: Secondary | ICD-10-CM | POA: Diagnosis not present

## 2020-09-10 DIAGNOSIS — M4316 Spondylolisthesis, lumbar region: Secondary | ICD-10-CM | POA: Diagnosis present

## 2020-09-10 DIAGNOSIS — Z7983 Long term (current) use of bisphosphonates: Secondary | ICD-10-CM

## 2020-09-10 DIAGNOSIS — Z7982 Long term (current) use of aspirin: Secondary | ICD-10-CM

## 2020-09-10 DIAGNOSIS — M5416 Radiculopathy, lumbar region: Secondary | ICD-10-CM | POA: Diagnosis present

## 2020-09-10 DIAGNOSIS — I10 Essential (primary) hypertension: Secondary | ICD-10-CM | POA: Diagnosis present

## 2020-09-10 DIAGNOSIS — Z6837 Body mass index (BMI) 37.0-37.9, adult: Secondary | ICD-10-CM

## 2020-09-10 HISTORY — PX: ANTERIOR LAT LUMBAR FUSION: SHX1168

## 2020-09-10 SURGERY — ANTERIOR LATERAL LUMBAR FUSION 1 LEVEL
Anesthesia: General | Site: Flank | Laterality: Right

## 2020-09-10 MED ORDER — POLYETHYLENE GLYCOL 3350 17 G PO PACK: 17.0000 g | PACK | Freq: Every day | ORAL | Status: DC | PRN

## 2020-09-10 MED ORDER — ACYCLOVIR 400 MG PO TABS: 400.0000 mg | ORAL_TABLET | Freq: Two times a day (BID) | ORAL | Status: DC | PRN

## 2020-09-10 MED ORDER — SUFENTANIL CITRATE 50 MCG/ML IV SOLN
INTRAVENOUS | Status: AC
  Filled 2020-09-10: qty 1

## 2020-09-10 MED ORDER — DEXAMETHASONE SODIUM PHOSPHATE 10 MG/ML IJ SOLN
INTRAMUSCULAR | Status: DC | PRN
  Administered 2020-09-10: 5 mg via INTRAVENOUS

## 2020-09-10 MED ORDER — PROPOFOL 10 MG/ML IV BOLUS
INTRAVENOUS | Status: DC | PRN
  Administered 2020-09-10: 150 mg via INTRAVENOUS

## 2020-09-10 MED ORDER — LACTATED RINGERS IV SOLN: INTRAVENOUS | Status: DC | PRN

## 2020-09-10 MED ORDER — SODIUM CHLORIDE 0.9% FLUSH: 3.0000 mL | INTRAVENOUS | Status: DC | PRN

## 2020-09-10 MED ORDER — MIDAZOLAM HCL 2 MG/2ML IJ SOLN
INTRAMUSCULAR | Status: AC
  Filled 2020-09-10: qty 2

## 2020-09-10 MED ORDER — THROMBIN 5000 UNITS EX SOLR
CUTANEOUS | Status: AC
  Filled 2020-09-10: qty 5000

## 2020-09-10 MED ORDER — THROMBIN 5000 UNITS EX SOLR: OROMUCOSAL | Status: DC | PRN

## 2020-09-10 MED ORDER — VITAMIN E 45 MG (100 UNIT) PO CAPS
400.0000 [IU] | ORAL_CAPSULE | Freq: Every day | ORAL | Status: DC
  Filled 2020-09-10: qty 4

## 2020-09-10 MED ORDER — MENTHOL 3 MG MT LOZG: 1.0000 | LOZENGE | OROMUCOSAL | Status: DC | PRN

## 2020-09-10 MED ORDER — HYDROXYZINE HCL 50 MG/ML IM SOLN: 50.0000 mg | Freq: Four times a day (QID) | INTRAMUSCULAR | Status: DC | PRN

## 2020-09-10 MED ORDER — ASPIRIN EC 81 MG PO TBEC: 81.0000 mg | DELAYED_RELEASE_TABLET | Freq: Every day | ORAL | Status: DC

## 2020-09-10 MED ORDER — CHLORHEXIDINE GLUCONATE CLOTH 2 % EX PADS: 6.0000 | MEDICATED_PAD | Freq: Once | CUTANEOUS | Status: DC

## 2020-09-10 MED ORDER — KETOROLAC TROMETHAMINE 15 MG/ML IJ SOLN
7.5000 mg | Freq: Four times a day (QID) | INTRAMUSCULAR | Status: DC
  Administered 2020-09-10 – 2020-09-11 (×3): 7.5 mg via INTRAVENOUS
  Filled 2020-09-10 (×3): qty 1

## 2020-09-10 MED ORDER — ACETAMINOPHEN 500 MG PO TABS: 1000.0000 mg | ORAL_TABLET | Freq: Three times a day (TID) | ORAL | Status: DC | PRN

## 2020-09-10 MED ORDER — PROBIOTIC PO TBEC: DELAYED_RELEASE_TABLET | Freq: Every day | ORAL | Status: DC

## 2020-09-10 MED ORDER — VITAMIN D3 25 MCG (1000 UNIT) PO TABS
5000.0000 [IU] | ORAL_TABLET | Freq: Every day | ORAL | Status: DC
  Filled 2020-09-10: qty 5

## 2020-09-10 MED ORDER — METHOCARBAMOL 1000 MG/10ML IJ SOLN
500.0000 mg | Freq: Four times a day (QID) | INTRAVENOUS | Status: DC | PRN
  Filled 2020-09-10: qty 5

## 2020-09-10 MED ORDER — PHENYLEPHRINE HCL-NACL 10-0.9 MG/250ML-% IV SOLN
INTRAVENOUS | Status: DC | PRN
  Administered 2020-09-10: 50 ug/min via INTRAVENOUS

## 2020-09-10 MED ORDER — 0.9 % SODIUM CHLORIDE (POUR BTL) OPTIME
TOPICAL | Status: DC | PRN
  Administered 2020-09-10: 1000 mL

## 2020-09-10 MED ORDER — SODIUM CHLORIDE 0.9% FLUSH
3.0000 mL | Freq: Two times a day (BID) | INTRAVENOUS | Status: DC
  Administered 2020-09-10: 3 mL via INTRAVENOUS

## 2020-09-10 MED ORDER — ACETAMINOPHEN 325 MG PO TABS: 650.0000 mg | ORAL_TABLET | ORAL | Status: DC | PRN

## 2020-09-10 MED ORDER — PROMETHAZINE HCL 25 MG/ML IJ SOLN: 6.2500 mg | INTRAMUSCULAR | Status: DC | PRN

## 2020-09-10 MED ORDER — HYDROMORPHONE HCL 1 MG/ML IJ SOLN
INTRAMUSCULAR | Status: AC
  Filled 2020-09-10: qty 1

## 2020-09-10 MED ORDER — B COMPLEX PO TABS: 1.0000 | ORAL_TABLET | Freq: Every day | ORAL | Status: DC

## 2020-09-10 MED ORDER — HYDROMORPHONE HCL 1 MG/ML IJ SOLN
0.2500 mg | INTRAMUSCULAR | Status: DC | PRN
  Administered 2020-09-10 (×3): 0.5 mg via INTRAVENOUS

## 2020-09-10 MED ORDER — ONDANSETRON HCL 4 MG PO TABS: 4.0000 mg | ORAL_TABLET | Freq: Four times a day (QID) | ORAL | Status: DC | PRN

## 2020-09-10 MED ORDER — PHENYLEPHRINE HCL (PRESSORS) 10 MG/ML IV SOLN
INTRAVENOUS | Status: DC | PRN
  Administered 2020-09-10: 200 ug via INTRAVENOUS

## 2020-09-10 MED ORDER — SUCCINYLCHOLINE CHLORIDE 20 MG/ML IJ SOLN
INTRAMUSCULAR | Status: DC | PRN
  Administered 2020-09-10: 100 mg via INTRAVENOUS

## 2020-09-10 MED ORDER — LIDOCAINE HCL (CARDIAC) PF 100 MG/5ML IV SOSY
PREFILLED_SYRINGE | INTRAVENOUS | Status: DC | PRN
  Administered 2020-09-10: 30 mg via INTRAVENOUS

## 2020-09-10 MED ORDER — LIDOCAINE-EPINEPHRINE 1 %-1:100000 IJ SOLN
INTRAMUSCULAR | Status: AC
  Filled 2020-09-10: qty 1

## 2020-09-10 MED ORDER — METOPROLOL SUCCINATE ER 50 MG PO TB24
50.0000 mg | ORAL_TABLET | Freq: Every day | ORAL | Status: DC
  Administered 2020-09-10: 50 mg via ORAL
  Filled 2020-09-10: qty 1

## 2020-09-10 MED ORDER — MIDAZOLAM HCL 5 MG/5ML IJ SOLN
INTRAMUSCULAR | Status: DC | PRN
  Administered 2020-09-10: 2 mg via INTRAVENOUS

## 2020-09-10 MED ORDER — ATORVASTATIN CALCIUM 40 MG PO TABS
40.0000 mg | ORAL_TABLET | Freq: Every day | ORAL | Status: DC
  Administered 2020-09-10: 40 mg via ORAL
  Filled 2020-09-10: qty 1

## 2020-09-10 MED ORDER — SUFENTANIL CITRATE 50 MCG/ML IV SOLN
INTRAVENOUS | Status: DC | PRN
  Administered 2020-09-10 (×3): 5 ug via INTRAVENOUS
  Administered 2020-09-10: 10 ug via INTRAVENOUS

## 2020-09-10 MED ORDER — MEPERIDINE HCL 25 MG/ML IJ SOLN
6.2500 mg | INTRAMUSCULAR | Status: DC | PRN
Start: 2020-09-10 — End: 2020-09-10

## 2020-09-10 MED ORDER — CAL MAG ZINC +D3 PO TABS: ORAL_TABLET | Freq: Every day | ORAL | Status: DC

## 2020-09-10 MED ORDER — KCL IN DEXTROSE-NACL 20-5-0.45 MEQ/L-%-% IV SOLN: INTRAVENOUS | Status: DC

## 2020-09-10 MED ORDER — RAMIPRIL 10 MG PO CAPS
10.0000 mg | ORAL_CAPSULE | Freq: Every evening | ORAL | Status: DC
  Administered 2020-09-10: 10 mg via ORAL
  Filled 2020-09-10 (×2): qty 1

## 2020-09-10 MED ORDER — CEFAZOLIN SODIUM-DEXTROSE 2-4 GM/100ML-% IV SOLN
2.0000 g | INTRAVENOUS | Status: AC
  Administered 2020-09-10: 2 g via INTRAVENOUS
  Filled 2020-09-10: qty 100

## 2020-09-10 MED ORDER — LORATADINE 10 MG PO TABS: 10.0000 mg | ORAL_TABLET | Freq: Every day | ORAL | Status: DC

## 2020-09-10 MED ORDER — ONDANSETRON HCL 4 MG/2ML IJ SOLN: 4.0000 mg | Freq: Four times a day (QID) | INTRAMUSCULAR | Status: DC | PRN

## 2020-09-10 MED ORDER — OXYCODONE HCL 5 MG PO TABS: 5.0000 mg | ORAL_TABLET | ORAL | Status: DC | PRN

## 2020-09-10 MED ORDER — ASCORBIC ACID 500 MG PO TABS
1000.0000 mg | ORAL_TABLET | Freq: Every day | ORAL | Status: DC
  Filled 2020-09-10: qty 2

## 2020-09-10 MED ORDER — PANTOPRAZOLE SODIUM 40 MG IV SOLR: 40.0000 mg | Freq: Every day | INTRAVENOUS | Status: DC

## 2020-09-10 MED ORDER — PROPOFOL 10 MG/ML IV BOLUS
INTRAVENOUS | Status: AC
  Filled 2020-09-10: qty 40

## 2020-09-10 MED ORDER — LIDOCAINE-EPINEPHRINE 1 %-1:100000 IJ SOLN
INTRAMUSCULAR | Status: DC | PRN
  Administered 2020-09-10: 5 mL

## 2020-09-10 MED ORDER — ORAL CARE MOUTH RINSE: 15.0000 mL | Freq: Once | OROMUCOSAL | Status: AC

## 2020-09-10 MED ORDER — HYDROCODONE-ACETAMINOPHEN 5-325 MG PO TABS
1.0000 | ORAL_TABLET | ORAL | Status: DC | PRN
  Administered 2020-09-10 – 2020-09-11 (×3): 2 via ORAL
  Filled 2020-09-10 (×3): qty 2

## 2020-09-10 MED ORDER — HYDROMORPHONE HCL 1 MG/ML IJ SOLN: 0.5000 mg | Freq: Once | INTRAMUSCULAR | Status: AC

## 2020-09-10 MED ORDER — SODIUM CHLORIDE 0.9 % IV SOLN
250.0000 mL | INTRAVENOUS | Status: DC
  Administered 2020-09-10: 250 mL via INTRAVENOUS

## 2020-09-10 MED ORDER — ACETAMINOPHEN 650 MG RE SUPP: 650.0000 mg | RECTAL | Status: DC | PRN

## 2020-09-10 MED ORDER — CEFAZOLIN SODIUM-DEXTROSE 2-4 GM/100ML-% IV SOLN
2.0000 g | Freq: Three times a day (TID) | INTRAVENOUS | Status: AC
  Administered 2020-09-10 – 2020-09-11 (×2): 2 g via INTRAVENOUS
  Filled 2020-09-10 (×2): qty 100

## 2020-09-10 MED ORDER — METHOCARBAMOL 500 MG PO TABS
500.0000 mg | ORAL_TABLET | Freq: Four times a day (QID) | ORAL | Status: DC | PRN
  Administered 2020-09-10 – 2020-09-11 (×3): 500 mg via ORAL
  Filled 2020-09-10 (×3): qty 1

## 2020-09-10 MED ORDER — PHENOL 1.4 % MT LIQD: 1.0000 | OROMUCOSAL | Status: DC | PRN

## 2020-09-10 MED ORDER — HYDROMORPHONE HCL 1 MG/ML IJ SOLN: 0.5000 mg | INTRAMUSCULAR | Status: DC | PRN

## 2020-09-10 MED ORDER — CHLORHEXIDINE GLUCONATE 0.12 % MT SOLN
15.0000 mL | Freq: Once | OROMUCOSAL | Status: AC
  Administered 2020-09-10: 15 mL via OROMUCOSAL
  Filled 2020-09-10: qty 15

## 2020-09-10 MED ORDER — BISACODYL 10 MG RE SUPP: 10.0000 mg | Freq: Every day | RECTAL | Status: DC | PRN

## 2020-09-10 MED ORDER — ALUM & MAG HYDROXIDE-SIMETH 200-200-20 MG/5ML PO SUSP: 30.0000 mL | Freq: Four times a day (QID) | ORAL | Status: DC | PRN

## 2020-09-10 MED ORDER — HYDROCODONE-ACETAMINOPHEN 7.5-325 MG PO TABS: 1.0000 | ORAL_TABLET | ORAL | Status: DC | PRN

## 2020-09-10 MED ORDER — SERTRALINE HCL 50 MG PO TABS
50.0000 mg | ORAL_TABLET | Freq: Every day | ORAL | Status: DC
  Administered 2020-09-10: 50 mg via ORAL
  Filled 2020-09-10: qty 1

## 2020-09-10 MED ORDER — ONDANSETRON HCL 4 MG/2ML IJ SOLN
INTRAMUSCULAR | Status: DC | PRN
  Administered 2020-09-10: 4 mg via INTRAVENOUS

## 2020-09-10 MED ORDER — HYDROMORPHONE HCL 1 MG/ML IJ SOLN
INTRAMUSCULAR | Status: AC
  Administered 2020-09-10: 0.5 mg via INTRAVENOUS
  Filled 2020-09-10: qty 1

## 2020-09-10 MED ORDER — LACTATED RINGERS IV SOLN: INTRAVENOUS | Status: DC

## 2020-09-10 MED ORDER — BUPIVACAINE HCL (PF) 0.5 % IJ SOLN
INTRAMUSCULAR | Status: AC
  Filled 2020-09-10: qty 30

## 2020-09-10 MED ORDER — FLEET ENEMA 7-19 GM/118ML RE ENEM: 1.0000 | ENEMA | Freq: Once | RECTAL | Status: DC | PRN

## 2020-09-10 MED ORDER — ZOLPIDEM TARTRATE 5 MG PO TABS: 5.0000 mg | ORAL_TABLET | Freq: Every evening | ORAL | Status: DC | PRN

## 2020-09-10 MED ORDER — BUPIVACAINE HCL (PF) 0.5 % IJ SOLN
INTRAMUSCULAR | Status: DC | PRN
  Administered 2020-09-10: 5 mL

## 2020-09-10 MED ORDER — ALENDRONATE SODIUM 70 MG PO TABS: 70.0000 mg | ORAL_TABLET | ORAL | Status: DC

## 2020-09-10 MED ORDER — PANTOPRAZOLE SODIUM 40 MG PO TBEC
40.0000 mg | DELAYED_RELEASE_TABLET | Freq: Every day | ORAL | Status: DC
  Administered 2020-09-10: 40 mg via ORAL
  Filled 2020-09-10: qty 1

## 2020-09-10 MED ORDER — DOCUSATE SODIUM 100 MG PO CAPS
100.0000 mg | ORAL_CAPSULE | Freq: Two times a day (BID) | ORAL | Status: DC
  Administered 2020-09-10: 100 mg via ORAL
  Filled 2020-09-10: qty 1

## 2020-09-10 MED ORDER — PROPOFOL 500 MG/50ML IV EMUL
INTRAVENOUS | Status: DC | PRN
  Administered 2020-09-10: 50 ug/kg/min via INTRAVENOUS

## 2020-09-10 SURGICAL SUPPLY — 56 items
BLADE CLIPPER SURG (BLADE) IMPLANT
CARTRIDGE OIL MAESTRO DRILL (MISCELLANEOUS) ×1 IMPLANT
COVER WAND RF STERILE (DRAPES) ×1 IMPLANT
DECANTER SPIKE VIAL GLASS SM (MISCELLANEOUS) ×2 IMPLANT
DERMABOND ADVANCED (GAUZE/BANDAGES/DRESSINGS) ×1
DERMABOND ADVANCED .7 DNX12 (GAUZE/BANDAGES/DRESSINGS) ×2 IMPLANT
DIFFUSER DRILL AIR PNEUMATIC (MISCELLANEOUS) ×1 IMPLANT
DRAPE C-ARM 42X72 X-RAY (DRAPES) ×2 IMPLANT
DRAPE C-ARMOR (DRAPES) ×2 IMPLANT
DRAPE LAPAROTOMY 100X72X124 (DRAPES) ×2 IMPLANT
DRSG OPSITE POSTOP 3X4 (GAUZE/BANDAGES/DRESSINGS) ×1 IMPLANT
DURAPREP 26ML APPLICATOR (WOUND CARE) ×2 IMPLANT
ELECT REM PT RETURN 9FT ADLT (ELECTROSURGICAL) ×2
ELECTRODE REM PT RTRN 9FT ADLT (ELECTROSURGICAL) ×1 IMPLANT
GAUZE 4X4 16PLY RFD (DISPOSABLE) IMPLANT
GLOVE BIO SURGEON STRL SZ8 (GLOVE) ×3 IMPLANT
GLOVE BIOGEL PI IND STRL 8.5 (GLOVE) ×1 IMPLANT
GLOVE BIOGEL PI INDICATOR 8.5 (GLOVE) ×2
GLOVE ECLIPSE 8.0 STRL XLNG CF (GLOVE) ×2 IMPLANT
GLOVE SRG 8 PF TXTR STRL LF DI (GLOVE) ×1 IMPLANT
GLOVE SURG SS PI 7.5 STRL IVOR (GLOVE) ×4 IMPLANT
GLOVE SURG UNDER POLY LF SZ8 (GLOVE) ×1
GOWN STRL REUS W/ TWL LRG LVL3 (GOWN DISPOSABLE) IMPLANT
GOWN STRL REUS W/ TWL XL LVL3 (GOWN DISPOSABLE) ×2 IMPLANT
GOWN STRL REUS W/TWL 2XL LVL3 (GOWN DISPOSABLE) ×1 IMPLANT
GOWN STRL REUS W/TWL LRG LVL3 (GOWN DISPOSABLE)
GOWN STRL REUS W/TWL XL LVL3 (GOWN DISPOSABLE) ×5
HEMOSTAT POWDER KIT SURGIFOAM (HEMOSTASIS) ×2 IMPLANT
KIT BASIN OR (CUSTOM PROCEDURE TRAY) ×2 IMPLANT
KIT DILATOR XLIF 5 (KITS) IMPLANT
KIT INFUSE XX SMALL 0.7CC (Orthopedic Implant) ×1 IMPLANT
KIT SURGICAL ACCESS MAXCESS 4 (KITS) ×1 IMPLANT
KIT TURNOVER KIT B (KITS) ×2 IMPLANT
KIT XLIF (KITS) ×1
MODULE NVM5 NEXT GEN EMG (NEEDLE) ×1 IMPLANT
MODULUS XLW 10X22X50MM 10DEG (Spine Construct) ×1 IMPLANT
NDL HYPO 25X1 1.5 SAFETY (NEEDLE) ×1 IMPLANT
NEEDLE HYPO 25X1 1.5 SAFETY (NEEDLE) ×2 IMPLANT
NS IRRIG 1000ML POUR BTL (IV SOLUTION) ×2 IMPLANT
OIL CARTRIDGE MAESTRO DRILL (MISCELLANEOUS)
PACK LAMINECTOMY NEURO (CUSTOM PROCEDURE TRAY) ×2 IMPLANT
PATTIES SURGICAL .5 X.5 (GAUZE/BANDAGES/DRESSINGS) ×1 IMPLANT
PLATE 2H 10MM (Plate) ×1 IMPLANT
PUTTY BONE ATTRAX 5CC STRIP (Putty) ×1 IMPLANT
SCREW DECADE 5.5X50 (Screw) ×2 IMPLANT
SPONGE LAP 4X18 RFD (DISPOSABLE) IMPLANT
SPONGE SURGIFOAM ABS GEL SZ50 (HEMOSTASIS) IMPLANT
STAPLER SKIN PROX WIDE 3.9 (STAPLE) ×2 IMPLANT
SUT VIC AB 1 CT1 18XBRD ANBCTR (SUTURE) ×1 IMPLANT
SUT VIC AB 1 CT1 8-18 (SUTURE) ×1
SUT VIC AB 2-0 CT1 18 (SUTURE) ×2 IMPLANT
SUT VIC AB 3-0 SH 8-18 (SUTURE) ×3 IMPLANT
TOWEL GREEN STERILE (TOWEL DISPOSABLE) ×2 IMPLANT
TOWEL GREEN STERILE FF (TOWEL DISPOSABLE) ×2 IMPLANT
TRAY FOLEY MTR SLVR 16FR STAT (SET/KITS/TRAYS/PACK) ×2 IMPLANT
WATER STERILE IRR 1000ML POUR (IV SOLUTION) ×2 IMPLANT

## 2020-09-10 NOTE — Addendum Note (Signed)
Addendum  created 09/10/20 1524 by Eligha Bridegroom, CRNA   Intraprocedure Meds edited

## 2020-09-10 NOTE — Op Note (Signed)
09/10/2020  11:52 AM  PATIENT:  Destiny Dillon  65 y.o. female  PRE-OPERATIVE DIAGNOSIS:  Spondylolisthesis, Lumbar region, spinal stenosis, lumbago, radiculopathy L 34 level  POST-OPERATIVE DIAGNOSIS:  Spondylolisthesis, Lumbar region, spinal stenosis, lumbago, radiculopathy L 34 level  PROCEDURE:  Procedure(s): Right Lumbar three-four  Anterolateral lumbar interbody fusion with lateral plate (Right)  SURGEON:  Surgeon(s) and Role:    Erline Levine, MD - Primary  PHYSICIAN ASSISTANT: Glenford Peers, NP  ASSISTANTS: Poteat, RN   ANESTHESIA:   general  EBL:  10 mL   BLOOD ADMINISTERED:none  DRAINS: none   LOCAL MEDICATIONS USED:  MARCAINE    and LIDOCAINE   SPECIMEN:  No Specimen  DISPOSITION OF SPECIMEN:  N/A  COUNTS:  YES  TOURNIQUET:  * No tourniquets in log *  DICTATION: Patient is a 65 year old with severe stenosis and spondylolisthesis of the lumbar spine. It was elected to take her to surgery for anterolateral decompression and lateral plate fixation at the L 34 level.  She had previously undergone fusion L 4 - S 1 levels.  Procedure: Patient was brought to the operating room and placed in a lateral decubitus position on the operative table and using orthogonally projected C-arm fluoroscopy the patient was placed so that the L 34  level was visualized in AP and lateral plane. The patient was then taped into position. The table was flexed so as to expose the L 34 level. Skin was marked along with a posterior finger dissection incision. His flank was then prepped and draped in usual sterile fashion. Posterior finger dissection was made to enter the retroperitoneal space and then subsequently the probe was inserted into the psoas muscle from the right side initially at the L 34 level. After mapping the neural elements were able to dock the probe per the midpoint of this vertebral level and without indications electrically of too close proximity to the neural tissues.  Subsequently the self-retaining tractor was.after sequential dilators were utilized the shim was employed and the interspace was cleared of psoas muscle and then incised. A thorough discectomy was performed. Instruments were used to clear the interspace of disc material. After thorough discectomy was performed and this was performed using AP and lateral fluoroscopy a 10 lordotic by 50 x 22 mm implant was packed with extra extra small BMP and Attrax. This was tamped into position using the slides and its position was confirmed on AP and lateral fluoroscopy. A Decade lateral plate (size 10) was affixed to the lateral spine with 5.5 x 50 mm screws in L 3 and L 4. All screws were torque locked and positioning was confirmed with AP and lateral fluoroscopy. . Hemostasis was assured the wounds were irrigated interrupted Vicryl sutures.Sterile occlusive dressing was placed with Dermabond and occlusive dressings. The patient was then extubated in the operating room and taken to recovery in stable and satisfactory condition having tolerated his operation well. Counts were correct at the end of the case.  Retractor Time:  27 minutes  PLAN OF CARE: Admit to inpatient   PATIENT DISPOSITION:  PACU - hemodynamically stable.   Delay start of Pharmacological VTE agent (>24hrs) due to surgical blood loss or risk of bleeding: yes

## 2020-09-10 NOTE — Anesthesia Postprocedure Evaluation (Signed)
Anesthesia Post Note  Patient: Destiny Dillon  Procedure(s) Performed: Right Lumbar three-four  Anterolateral lumbar interbody fusion with lateral plate (Right Flank)     Patient location during evaluation: PACU Anesthesia Type: General Level of consciousness: awake and alert Pain management: pain level controlled Vital Signs Assessment: post-procedure vital signs reviewed and stable Respiratory status: spontaneous breathing Cardiovascular status: stable Anesthetic complications: no   No complications documented.  Last Vitals:  Vitals:   09/10/20 1347 09/10/20 1400  BP: 112/63 120/75  Pulse: 67 67  Resp: 10 12  Temp:    SpO2: 99% 99%    Last Pain:  Vitals:   09/10/20 1330  TempSrc:   PainSc: [place]

## 2020-09-10 NOTE — Progress Notes (Signed)
Orthopedic Tech Progress Note Patient Details:  Destiny Dillon 08-Oct-1955 [id number] Patient has brace per MD Patient ID: [name], female   DOB: 1955/08/20, 65 y.o.   MRN: [id number]   [name] 09/10/2020, 2:07 PM

## 2020-09-10 NOTE — H&P (Signed)
Patient ID:   [id number] Patient: Destiny Dillon  Date of Birth: 05/10/1956 Visit Type: Office Visit   Date: 07/08/2020 10:15 AM Provider: Marchia Meiers. Vertell Limber MD   This 65 year old female presents for MRI Review.  HISTORY OF PRESENT ILLNESS: 1.  MRI Review  Patient returns to review her MRI  The patient is complaining of right greater than left leg pain.  She says the pain goes away when she lays down but that it is is sore when she is sitting and way worse when she is upright.  She says both legs bother her.  She has been using gabapentin but says this does not help her a lot.  I have encouraged her to pursue water aerobics and weight loss as we have previously discussed.   The patient's MRI demonstrates prior fusion L4-5 and L5-S1 levels with progression of severe spinal stenosis at the L3-4 level.  Based on the patient's significant pain complaints and her imaging studies, I have recommended XL IF L3-4 level with lateral plate.      Medical/Surgical/Interim History Reviewed, no change.  Last detailed document date:06/07/2017.     PAST MEDICAL HISTORY, SURGICAL HISTORY, FAMILY HISTORY, SOCIAL HISTORY AND REVIEW OF SYSTEMS I have reviewed the patient's past medical, surgical, family and social history as well as the comprehensive review of systems as included on the [facility] history form dated 03/08/2019, which I have signed.  Family History: Reviewed, no changes.  Last detailed document date:06/07/2017.   Social History: Reviewed, no changes. Last detailed document date: 06/07/2017.    MEDICATIONS: (added, continued or stopped this visit) Started Medication Directions Instruction Stopped  atorvastatin 40 mg tablet take 1 tablet by oral route  every day   05/20/2020 hydrocodone 7.5 mg-acetaminophen 325 mg tablet take 1 tablet by oral route  every 12 hours as needed for pain  07/08/2020 07/08/2020 hydrocodone 7.5  mg-acetaminophen 325 mg tablet take 1 tablet by oral route  every 12 hours as needed for pain    metoprolol tartrate 50 mg tablet take 1 tablet by oral route 2 times every day with meals   05/27/2020 Neurontin 300 mg capsule take 1 capsule by oral route 3 times every day    ramipril 10 mg capsule take 1 capsule by oral route  every day    sertraline 50 mg tablet take 1 tablet by oral route  every day   06/03/2020 Valium 10 mg tablet Take one by oral route pior to MRI      ALLERGIES: Ingredient Reaction Medication Name Comment CODEINE Lightheadedness   ERYTHROMYCIN BASE Lightheadedness; Vomiting    Reviewed, no changes.    PHYSICAL EXAM:  Vitals Date Temp F BP Pulse Ht In Wt Lb BMI BSA Pain Score 07/08/2020  127/80 73 61 198 37.41  7/10     IMPRESSION:  The patient is complaining of bilateral lower extremity pain and weakness.  Her imaging shows severe L3-4 stenosis with healed L4-5 and L5-S1 fusion.  PLAN: Proceed with XL IF L3-4 with lateral plate.  Risks and benefits were discussed in detail with the patient and she wishes to proceed with surgery.  Patient education was performed at length.  Orders: Diagnostic Procedures: Assessment Procedure M54.16 Lumbar Spine- AP/Lat Instruction(s)/Education: Assessment Instruction I10 Lifestyle education [phone] Dietary management education, guidance, and counseling Miscellaneous: Assessment  M43.16 LSO Brace  Completed Orders (this encounter) Order Details Reason Side Interpretation Result Initial Treatment Date Region Lifestyle education Patient will follow up with  Primary Care Physician.       Dietary management education, guidance, and counseling Encouraged patient to eat well balanced diet.        Assessment/Plan  # Detail Type Description  1. Assessment Degenerative lumbar spinal stenosis (M48.061).     2. Assessment Lumbago with  sciatica, right side (M54.41).     3. Assessment Lumbar radiculopathy (M54.16).     4. Assessment Spondylolisthesis, lumbar region (M43.16).  Plan Orders LSO Brace.     5. Assessment Essential (primary) hypertension (I10).     6. Assessment Body mass index (BMI) 37.0-37.9, adult (Z68.37).  Plan Orders Today's instructions / counseling include(s) Dietary management education, guidance, and counseling. Clinical information/comments: Encouraged patient to eat well balanced diet.       Pain Management Plan Pain Scale: 7/10. Method: Numeric Pain Intensity Scale. Location: back. Onset: 06/07/2014. Duration: varies. Quality: discomforting. Pain management follow-up plan of care: Patient will continue medication management.Marland Kitchen     MEDICATIONS PRESCRIBED TODAY    Rx Quantity Refills HYDROCODONE-ACETAMINOPHEN 7.5 mg-325 mg  60 0           Provider:  Marchia Meiers. Vertell Limber MD  07/11/2020 01:17 PM    Dictation edited by: Marchia Meiers. Vertell Limber    CC [place]iders: [place]  [phone]   [place] [address] [place] [address]-               Electronically signed by Marchia Meiers. Vertell Limber MD on 07/11/2020 01:17 PM

## 2020-09-10 NOTE — Interval H&P Note (Signed)
History and Physical Interval Note:  09/10/2020 9:57 AM  Destiny Dillon  has presented today for surgery, with the diagnosis of Spondylolisthesis, Lumbar region.  The various methods of treatment have been discussed with the patient and family. After consideration of risks, benefits and other options for treatment, the patient has consented to  Procedure(s) with comments: Right Lumbar 3-4 Anterolateral lumbar interbody fusion with lateral plate (Right) - 3C/RM 21 as a surgical intervention.  The patient's history has been reviewed, patient examined, no change in status, stable for surgery.  I have reviewed the patient's chart and labs.  Questions were answered to the patient's satisfaction.     Peggyann Shoals

## 2020-09-10 NOTE — Progress Notes (Signed)
Awake, alert, conversant.  MAEW with good strength.  Doing well. 

## 2020-09-10 NOTE — Brief Op Note (Signed)
09/10/2020  11:52 AM  PATIENT:  Destiny Dillon  65 y.o. female  PRE-OPERATIVE DIAGNOSIS:  Spondylolisthesis, Lumbar region, spinal stenosis, lumbago, radiculopathy L 34 level  POST-OPERATIVE DIAGNOSIS:  Spondylolisthesis, Lumbar region, spinal stenosis, lumbago, radiculopathy L 34 level  PROCEDURE:  Procedure(s): Right Lumbar three-four  Anterolateral lumbar interbody fusion with lateral plate (Right)  SURGEON:  Surgeon(s) and Role:    Erline Levine, MD - Primary  PHYSICIAN ASSISTANT: Glenford Peers, NP  ASSISTANTS: Poteat, RN   ANESTHESIA:   general  EBL:  10 mL   BLOOD ADMINISTERED:none  DRAINS: none   LOCAL MEDICATIONS USED:  MARCAINE    and LIDOCAINE   SPECIMEN:  No Specimen  DISPOSITION OF SPECIMEN:  N/A  COUNTS:  YES  TOURNIQUET:  * No tourniquets in log *  DICTATION: Patient is a 65 year old with severe stenosis and spondylolisthesis of the lumbar spine. It was elected to take her to surgery for anterolateral decompression and lateral plate fixation at the L 34 level.  She had previously undergone fusion L 4 - S 1 levels.  Procedure: Patient was brought to the operating room and placed in a lateral decubitus position on the operative table and using orthogonally projected C-arm fluoroscopy the patient was placed so that the L 34  level was visualized in AP and lateral plane. The patient was then taped into position. The table was flexed so as to expose the L 34 level. Skin was marked along with a posterior finger dissection incision. His flank was then prepped and draped in usual sterile fashion. Posterior finger dissection was made to enter the retroperitoneal space and then subsequently the probe was inserted into the psoas muscle from the right side initially at the L 34 level. After mapping the neural elements were able to dock the probe per the midpoint of this vertebral level and without indications electrically of too close proximity to the neural tissues.  Subsequently the self-retaining tractor was.after sequential dilators were utilized the shim was employed and the interspace was cleared of psoas muscle and then incised. A thorough discectomy was performed. Instruments were used to clear the interspace of disc material. After thorough discectomy was performed and this was performed using AP and lateral fluoroscopy a 10 lordotic by 50 x 22 mm implant was packed with extra extra small BMP and Attrax. This was tamped into position using the slides and its position was confirmed on AP and lateral fluoroscopy. A Decade lateral plate (size 10) was affixed to the lateral spine with 5.5 x 50 mm screws in L 3 and L 4. All screws were torque locked and positioning was confirmed with AP and lateral fluoroscopy. . Hemostasis was assured the wounds were irrigated interrupted Vicryl sutures.Sterile occlusive dressing was placed with Dermabond and occlusive dressings. The patient was then extubated in the operating room and taken to recovery in stable and satisfactory condition having tolerated his operation well. Counts were correct at the end of the case.  Retractor Time:  27 minutes  PLAN OF CARE: Admit to inpatient   PATIENT DISPOSITION:  PACU - hemodynamically stable.   Delay start of Pharmacological VTE agent (>24hrs) due to surgical blood loss or risk of bleeding: yes

## 2020-09-10 NOTE — Anesthesia Procedure Notes (Signed)
Procedure Name: Intubation Date/Time: 09/10/2020 10:12 AM Performed by: Eligha Bridegroom, CRNA Pre-anesthesia Checklist: Patient identified, Emergency Drugs available, Suction available, Patient being monitored and Timeout performed Patient Re-evaluated:Patient Re-evaluated prior to induction Oxygen Delivery Method: Circle system utilized Preoxygenation: Pre-oxygenation with 100% oxygen Induction Type: IV induction Laryngoscope Size: Mac and 4 Grade View: Grade II Tube type: Oral Tube size: 7.0 mm Number of attempts: 1 Placement Confirmation: ETT inserted through vocal cords under direct vision,  positive ETCO2 and breath sounds checked- equal and bilateral Secured at: 21 cm Tube secured with: Tape Dental Injury: Teeth and Oropharynx as per pre-operative assessment

## 2020-09-10 NOTE — Transfer of Care (Signed)
Immediate Anesthesia Transfer of Care Note  Patient: Simonne Boulos Smyre  Procedure(s) Performed: Right Lumbar three-four  Anterolateral lumbar interbody fusion with lateral plate (Right Flank)  Patient Location: PACU  Anesthesia Type:General  Level of Consciousness: drowsy  Airway & Oxygen Therapy: Patient Spontanous Breathing and Patient connected to face mask oxygen  Post-op Assessment: Report given to RN and Post -op Vital signs reviewed and stable  Post vital signs: Reviewed and stable  Last Vitals:  Vitals Value Taken Time  BP 108/61 09/10/20 1202  Temp    Pulse 78 09/10/20 1203  Resp 11 09/10/20 1203  SpO2 100 % 09/10/20 1203  Vitals shown include unvalidated device data.  Last Pain:  Vitals:   09/10/20 0748  TempSrc:   PainSc: 4       Patients Stated Pain Goal: 3 (47/09/62 8366)  Complications: No complications documented.

## 2020-09-11 ENCOUNTER — Encounter (HOSPITAL_COMMUNITY): Payer: Self-pay | Admitting: Neurosurgery

## 2020-09-11 NOTE — Progress Notes (Signed)
Subjective: Patient reports that she is doing well. Her only complaint is of gas and mild incisional pain. No acute events overnight.   Objective: Vital signs in last 24 hours: Temp:  [97.9 F (36.6 C)-98.6 F (37 C)] 98.2 F (36.8 C) (02/09 0748) Pulse Rate:  [62-83] 62 (02/09 0748) Resp:  [8-20] 18 (02/09 0748) BP: (94-132)/(50-75) 114/64 (02/09 0748) SpO2:  [92 %-100 %] 98 % (02/09 0748)  Intake/Output from previous day: 02/08 0701 - 02/09 0700 In: 1390 [P.O.:240; I.V.:950; IV Piggyback:200] Out: 130 [Urine:120; Blood:10] Intake/Output this shift: No intake/output data recorded.  Physical Exam: Patient is awake, A/O X 4, conversant, and in good spirits. Speech is fluent and appropriate. Doing well. MAEW with good strength that is symmetric bilaterally.  Dressings areclean dry intact. Incisions arewell approximated with no drainage, erythema, or edema.  LSO brace in place   Lab Results: No results for input(s): WBC, HGB, HCT, PLT in the last 72 hours. BMET No results for input(s): NA, K, CL, CO2, GLUCOSE, BUN, CREATININE, CALCIUM in the last 72 hours.  Studies/Results: DG Lumbar Spine 2-3 Views  Result Date: 09/10/2020 CLINICAL DATA:  L3-4 XLIF EXAM: DG C-ARM 1-60 MIN; LUMBAR SPINE - 2-3 VIEW COMPARISON:  None. FLUOROSCOPY TIME:  Fluoroscopy Time:  1 minutes 22 seconds Radiation Exposure Index (if provided by the fluoroscopic device): 63.76 mGy Number of Acquired Spot Images: 2 FINDINGS: Changes of fusion at L3-4 with lateral fixation on the right are seen. Prior changes of lumbar fusion from L4-S1 are seen and stable. No radiopaque foreign body is noted on these limited images. IMPRESSION: Status post L3-4 fusion with lateral fixation on the right. No radiopaque foreign body is noted. Critical Value/emergent results were called by telephone at the time of interpretation on 09/10/2020 at 11:54 am to [facility] in OR 21, who verbally acknowledged these results. Electronically Signed    By: Inez Catalina M.D.   On: 09/10/2020 11:54   DG C-Arm 1-60 Min  Result Date: 09/10/2020 CLINICAL DATA:  L3-4 XLIF EXAM: DG C-ARM 1-60 MIN; LUMBAR SPINE - 2-3 VIEW COMPARISON:  None. FLUOROSCOPY TIME:  Fluoroscopy Time:  1 minutes 22 seconds Radiation Exposure Index (if provided by the fluoroscopic device): 63.76 mGy Number of Acquired Spot Images: 2 FINDINGS: Changes of fusion at L3-4 with lateral fixation on the right are seen. Prior changes of lumbar fusion from L4-S1 are seen and stable. No radiopaque foreign body is noted on these limited images. IMPRESSION: Status post L3-4 fusion with lateral fixation on the right. No radiopaque foreign body is noted. Critical Value/emergent results were called by telephone at the time of interpretation on 09/10/2020 at 11:54 am to [facility] in OR 21, who verbally acknowledged these results. Electronically Signed   By: Inez Catalina M.D.   On: 09/10/2020 11:54    Assessment/Plan: Patient is post-op day 1 s/p XLIF L3/4 with lateral plate. She is recovering well and reports a resolution of her preoperative symptoms.  Her only complaint is mild incisional discomfort.  She ambulated well with PT this morning.  She is awaiting OT evaluation.  Continue LSO brace when OOB. Continue working on pain control, mobility and ambulating patient. Will plan for discharge today.    LOS: 1 day     Marvis Moeller, DNP, NP-C 09/11/2020, 8:10 AM

## 2020-09-11 NOTE — Discharge Summary (Signed)
Physician Discharge Summary  Patient ID: Destiny Dillon MRN: [id number] DOB/AGE: [birth year] 65 y.o.  Admit date: 09/10/2020 Discharge date: 09/11/2020  Admission Diagnoses:  Spondylolisthesis, Lumbar region, spinal stenosis, lumbago, radiculopathy L 34 level  Discharge Diagnoses:  Spondylolisthesis, Lumbar region, spinal stenosis, lumbago, radiculopathy L 34 level Active Problems:   Spondylolisthesis of lumbar region   Discharged Condition: good  Hospital Course: The patient was admitted on 09/10/2020 and taken to the operating room where the patient underwent XLIF L3-4 with lateral plate. The patient tolerated the procedure well and was taken to the recovery room and then to the floor in stable condition. The hospital course was routine. There were no complications. The wounds remained clean dry and intact. Pt had appropriate back soreness. No complaints of leg pain or new N/T/W. The patient remained afebrile with stable vital signs, and tolerated a regular diet. The patient continued to increase activities, and pain was well controlled with oral pain medications.   Consults: None  Significant Diagnostic Studies: radiology: X-Ray  Treatments: surgery: Right Lumbar three-four  Anterolateral lumbar interbody fusion with lateral plate (Right)  Discharge Exam: Blood pressure 114/64, pulse 62, temperature 98.2 F (36.8 C), temperature source Oral, resp. rate 18, height 5\' 1"  (1.549 m), weight 87.1 kg, SpO2 98 %.  Physical Exam: Patient is awake, A/O X 4, conversant, and in good spirits. Speech is fluent and appropriate. Doing well. MAEW with good strength that is symmetric bilaterally.  Dressings areclean dry intact. Incisions arewell approximated with no drainage, erythema, or edema.  LSO brace in place  Disposition: Discharge disposition: 01-Home or Self Care        Allergies as of 09/11/2020      Reactions   Codeine Other (See Comments)   hallucinations    Erythromycin  Nausea And Vomiting      Medication List    STOP taking these medications   HYDROcodone-acetaminophen 7.5-325 MG tablet Commonly known as: NORCO     TAKE these medications   acetaminophen 500 MG tablet Commonly known as: TYLENOL Take 1,000 mg by mouth every 8 (eight) hours as needed for mild pain.   acyclovir 400 MG tablet Commonly known as: ZOVIRAX Take 400 mg by mouth 2 (two) times daily as needed (as needed for fever blisters).   alendronate 70 MG tablet Commonly known as: FOSAMAX Take 70 mg by mouth every Wednesday. Take with a full glass of water on an empty stomach.   aspirin EC 81 MG tablet Take 81 mg by mouth daily.   atorvastatin 40 MG tablet Commonly known as: LIPITOR Take 40 mg by mouth daily at 6 PM.   b complex vitamins tablet Take 1 tablet by mouth daily.   CAL MAG ZINC +D3 PO Take 1 tablet by mouth daily.   cetirizine 10 MG tablet Commonly known as: ZYRTEC Take 10 mg by mouth daily.   Cholecalciferol 125 MCG (5000 UT) capsule Take 5,000 Units by mouth daily.   FISH OIL PO Take 2,400 mg by mouth daily.   metoprolol succinate 50 MG 24 hr tablet Commonly known as: TOPROL-XL Take 50 mg by mouth daily.   omeprazole 20 MG capsule Commonly known as: PRILOSEC Take 20 mg by mouth every evening.   PROBIOTIC PO Take 1 tablet by mouth daily.   ramipril 10 MG capsule Commonly known as: ALTACE Take 10 mg by mouth every evening.   sertraline 50 MG tablet Commonly known as: ZOLOFT Take 50 mg by mouth at bedtime.   vitamin  C 1000 MG tablet Take 1,000 mg by mouth daily.   vitamin E 180 MG (400 UNITS) capsule Take 400 Units by mouth daily.       Follow-up Information    Erline Levine, MD Follow up.   Specialty: Neurosurgery Contact information: [address] [place] [address] [phone]               Signed: Marvis Moeller, DNP, NP-C 09/11/2020, 9:18 AM

## 2020-09-11 NOTE — Discharge Instructions (Signed)
Wound Care Remove the outer dressing in 2-3 days Leave incision open to air. You may shower. Do not scrub directly on incision.  Do not put any creams, lotions, or ointments on incision. Activity Walk each and every day, increasing distance each day. No lifting greater than 5 lbs.  Avoid bending, arching, and twisting. No driving for 2 weeks; may ride as a passenger locally. If provided with back brace, wear when out of bed.  It is not necessary to wear in bed. Diet Resume your normal diet.  Return to Work Will be discussed at you follow up appointment. Call Your Doctor If Any of These Occur Redness, drainage, or swelling at the wound.  Temperature greater than 101 degrees. Severe pain not relieved by pain medication. Incision starts to come apart. Follow Up Appt Call today for appointment in 2-3 weeks ([phone]) or for problems.  If you have any hardware placed in your spine, you will need an x-ray before your appointment.

## 2020-09-11 NOTE — Evaluation (Signed)
Occupational Therapy Evaluation Patient Details Name: Destiny Dillon MRN: [id number] DOB: [birth year] Today's Date: 09/11/2020    History of Present Illness Pt is a 65 y/o female who presents s/p L3-4 ALIF on 09/10/20. PMH significant for neuropathy, MI, CAD, R breast CA s/p mastectomy.   Clinical Impression   Patient evaluated by Occupational Therapy with no further acute OT needs identified. Pt demonstrated ability to complete full body dressing, grooming while standing, and toilet transfers with supervision to modified independence.  All education has been completed and the patient has no further questions. See below for any follow-up Occupational Therapy or equipment needs. OT to sign off. Thank you for referral.      Follow Up Recommendations  No OT follow up    Equipment Recommendations  None recommended by OT    Recommendations for Other Services       Precautions / Restrictions Precautions Precautions: Fall;Back Precaution Booklet Issued: No Precaution Comments: Reviewed handout and pt was cued for precautions during functional mobility. Required Braces or Orthoses: Spinal Brace Spinal Brace: Lumbar corset;Applied in sitting position Restrictions Weight Bearing Restrictions: No      Mobility Bed Mobility Overal bed mobility: Modified Independent Bed Mobility: Rolling;Sidelying to Sit           General bed mobility comments: pt sitting EOB upon arrival    Transfers Overall transfer level: Modified independent Equipment used: None Transfers: Sit to/from Stand           General transfer comment: Pt demonstrated good posture to power-up to full stand. No assist required. Pt demonstrated proper hand placement on seated surface for safety.    Balance Overall balance assessment: Modified Independent                                         ADL either performed or assessed with clinical judgement   ADL Overall ADL's : Needs  assistance/impaired Eating/Feeding: Independent   Grooming: Supervision/safety;Standing Grooming Details (indicate cue type and reason): at sink level with compensatory strategies Upper Body Bathing: Independent   Lower Body Bathing: Supervison/ safety   Upper Body Dressing : Modified independent   Lower Body Dressing: Supervision/safety;Sit to/from stand Lower Body Dressing Details (indicate cue type and reason): with use of AE Toilet Transfer: Modified Independent Toilet Transfer Details (indicate cue type and reason): to standard commode with grab bars Toileting- Clothing Manipulation and Hygiene: Supervision/safety Toileting - Clothing Manipulation Details (indicate cue type and reason): educated pt on use of toilet tongs     Functional mobility during ADLs: Modified independent General ADL Comments: supervision at times for compensatory/adaptive strategies for adherence to precautions     Vision         Perception     Praxis      Pertinent Vitals/Pain Pain Assessment: 0-10 Pain Score: 2  Faces Pain Scale: Hurts little more Pain Location: back Pain Descriptors / Indicators: Operative site guarding;Aching Pain Intervention(s): Limited activity within patient's tolerance;Monitored during session     Hand Dominance Right   Extremity/Trunk Assessment Upper Extremity Assessment Upper Extremity Assessment: Overall WFL for tasks assessed   Lower Extremity Assessment Lower Extremity Assessment: Generalized weakness   Cervical / Trunk Assessment Cervical / Trunk Assessment: Other exceptions Cervical / Trunk Exceptions: s/p surgery   Communication Communication Communication: No difficulties   Cognition Arousal/Alertness: Awake/alert Behavior During Therapy: WFL for tasks assessed/performed Overall Cognitive Status: Within  Functional Limits for tasks assessed                                 General Comments: able to recall 3/3 precautions    General Comments  vss, son present during session    Exercises     Shoulder Instructions      Home Living Family/patient expects to be discharged to:: Private residence Living Arrangements: Alone Available Help at Discharge: Family;Available 24 hours/day Type of Home: House Home Access: Stairs to enter [employer] of Steps: 4 Entrance Stairs-Rails: Right Home Layout: One level     Bathroom Shower/Tub: Occupational psychologist: Standard     Home Equipment: Environmental consultant - 2 wheels;Bedside commode;Shower seat          Prior Functioning/Environment Level of Independence: Independent                 OT Problem List: Decreased knowledge of precautions;Pain      OT Treatment/Interventions:      OT Goals(Current goals can be found in the care plan section) Acute Rehab OT Goals Patient Stated Goal: to go home today OT Goal Formulation: With patient Time For Goal Achievement: 09/18/20 Potential to Achieve Goals: Good  OT Frequency:     Barriers to D/C:            Co-evaluation              AM-PAC OT "6 Clicks" Daily Activity     Outcome Measure Help from another person eating meals?: None Help from another person taking care of personal grooming?: None Help from another person toileting, which includes using toliet, bedpan, or urinal?: None Help from another person bathing (including washing, rinsing, drying)?: None Help from another person to put on and taking off regular upper body clothing?: None Help from another person to put on and taking off regular lower body clothing?: None 6 Click Score: 24   End of Session Equipment Utilized During Treatment: Back brace Nurse Communication: Mobility status  Activity Tolerance: Patient tolerated treatment well Patient left: in bed;with call bell/phone within reach;with family/visitor present  OT Visit Diagnosis: Other abnormalities of gait and mobility (R26.89);Pain Pain - part of body:   (back surgical site)                Time: 4268-3419 OT Time Calculation (min): 25 min Charges:  OT General Charges $OT Visit: 1 Visit OT Evaluation $OT Eval Low Complexity: 1 Low OT Treatments $Self Care/Home Management : 8-22 mins  Helene Kelp OTR/L Acute Rehabilitation Services Office: [phone]   Wyn Forster 09/11/2020, 9:05 AM

## 2020-09-11 NOTE — Plan of Care (Signed)
Patient alert and oriented, voiding adequately with no c/o pain or discomfort. Patient discharged home with son at bedside. Discharged instructions given with stated understanding. Patient has an appointment with MD in 2 weeks

## 2020-09-11 NOTE — Evaluation (Signed)
Physical Therapy Evaluation and Discharge Patient Details Name: Destiny Dillon MRN: [id number] DOB: 09-04-1955 Today's Date: 09/11/2020   History of Present Illness  Pt is a 65 y/o female who presents s/p L3-4 ALIF on 09/10/20. PMH significant for neuropathy, MI, CAD, R breast CA s/p mastectomy.    Clinical Impression  Patient evaluated by Physical Therapy with no further acute PT needs identified. All education has been completed and the patient has no further questions. Pt was able to demonstrate transfers and ambulation with gross modified independence and stair negotiation with supervision for safety. Pt was educated on precautions, brace application/wearing schedule, appropriate activity progression, and car transfer. See below for any follow-up Physical Therapy or equipment needs. PT is signing off. Thank you for this referral.     Follow Up Recommendations No PT follow up;Supervision for mobility/OOB    Equipment Recommendations  None recommended by PT    Recommendations for Other Services       Precautions / Restrictions Precautions Precautions: Fall;Back Precaution Booklet Issued: No Precaution Comments: Reviewed handout and pt was cued for precautions during functional mobility. Required Braces or Orthoses: Spinal Brace Spinal Brace: Lumbar corset;Applied in sitting position Restrictions Weight Bearing Restrictions: No      Mobility  Bed Mobility Overal bed mobility: Modified Independent Bed Mobility: Rolling;Sidelying to Sit           General bed mobility comments: Light cues for optimal log roll technique but pt was able to complete with HOB flat and rails lowered to simulate home environment.    Transfers Overall transfer level: Modified independent Equipment used: None Transfers: Sit to/from Stand           General transfer comment: Pt demonstrated good posture to power-up to full stand. No assist required. Pt demonstrated proper hand placement on  seated surface for safety.  Ambulation/Gait Ambulation/Gait assistance: Supervision Gait Distance (Feet): 350 Feet Assistive device: None Gait Pattern/deviations: Step-through pattern;Decreased stride length;Trunk flexed Gait velocity: Decreased Gait velocity interpretation: 1.31 - 2.62 ft/sec, indicative of limited community ambulator General Gait Details: VC's for maintenance of precautions. No assist required. No overt LOB noted.  Stairs Stairs: Yes Stairs assistance: Supervision Stair Management: One rail Right;Step to pattern;Forwards Number of Stairs: 10 General stair comments: VC's for sequencing and general safety. No assist required however close supervision provided for safety.  Wheelchair Mobility    Modified Rankin (Stroke Patients Only)       Balance Overall balance assessment: Modified Independent                                           Pertinent Vitals/Pain Pain Assessment: 0-10 Pain Score: 2  Faces Pain Scale: Hurts little more Pain Location: back Pain Descriptors / Indicators: Operative site guarding;Aching Pain Intervention(s): Limited activity within patient's tolerance;Monitored during session    Home Living Family/patient expects to be discharged to:: Private residence Living Arrangements: Alone Available Help at Discharge: Family;Available 24 hours/day Type of Home: House Home Access: Stairs to enter Entrance Stairs-Rails: Right Entrance Stairs-Number of Steps: 4 Home Layout: One level Home Equipment: Walker - 2 wheels;Bedside commode;Shower seat      Prior Function Level of Independence: Independent               Hand Dominance   Dominant Hand: Right    Extremity/Trunk Assessment   Upper Extremity Assessment Upper Extremity Assessment: Overall [facility]  for tasks assessed    Lower Extremity Assessment Lower Extremity Assessment: Generalized weakness    Cervical / Trunk Assessment Cervical / Trunk  Assessment: Other exceptions Cervical / Trunk Exceptions: s/p surgery  Communication   Communication: No difficulties  Cognition Arousal/Alertness: Awake/alert Behavior During Therapy: WFL for tasks assessed/performed Overall Cognitive Status: Within Functional Limits for tasks assessed                                 General Comments: able to recall 3/3 precautions      General Comments General comments (skin integrity, edema, etc.): vss, son present during session    Exercises     Assessment/Plan    PT Assessment Patent does not need any further PT services  PT Problem List         PT Treatment Interventions      PT Goals (Current goals can be found in the Care Plan section)  Acute Rehab PT Goals Patient Stated Goal: to go home today PT Goal Formulation: All assessment and education complete, DC therapy    Frequency     Barriers to discharge        Co-evaluation               AM-PAC PT "6 Clicks" Mobility  Outcome Measure Help needed turning from your back to your side while in a flat bed without using bedrails?: None Help needed moving from lying on your back to sitting on the side of a flat bed without using bedrails?: None Help needed moving to and from a bed to a chair (including a wheelchair)?: None Help needed standing up from a chair using your arms (e.g., wheelchair or bedside chair)?: None Help needed to walk in hospital room?: None Help needed climbing 3-5 steps with a railing? : A Little 6 Click Score: 23    End of Session Equipment Utilized During Treatment: Gait belt;Back brace Activity Tolerance: Patient tolerated treatment well Patient left: in bed;with call bell/phone within reach;with family/visitor present Nurse Communication: Mobility status PT Visit Diagnosis: Unsteadiness on feet (R26.81);Pain Pain - part of body:  (back)    Time: 7915-0413 PT Time Calculation (min) (ACUTE ONLY): 25 min   Charges:   PT  Evaluation $PT Eval Low Complexity: 1 Low PT Treatments $Gait Training: 8-22 mins        Rolinda Roan, PT, DPT Acute Rehabilitation Services Pager: [phone] Office: [phone]   Thelma Comp 09/11/2020, 9:55 AM

## 2021-02-13 ENCOUNTER — Telehealth: Payer: Self-pay

## 2021-02-13 ENCOUNTER — Other Ambulatory Visit: Payer: Self-pay | Admitting: Neurosurgery

## 2021-02-13 DIAGNOSIS — M5416 Radiculopathy, lumbar region: Secondary | ICD-10-CM

## 2021-02-13 NOTE — Progress Notes (Signed)
Phone call to patient to verify medication list and allergies for myelogram procedure. Pt instructed to hold Zoloft for 48hrs prior to myelogram appointment time and 24 hours after appointment. Pt also instructed to have a driver the day of the procedure, the procedure would take around 2 hours, and discharge instructions discussed. Pt verbalized understanding.   

## 2021-02-18 ENCOUNTER — Other Ambulatory Visit: Payer: Self-pay

## 2021-02-18 ENCOUNTER — Ambulatory Visit
Admission: RE | Admit: 2021-02-18 | Discharge: 2021-02-18 | Disposition: A | Discharge status: Home / Self Care | Payer: Medicare Other | Source: Ambulatory Visit | Attending: Neurosurgery | Admitting: Neurosurgery

## 2021-02-18 DIAGNOSIS — M5416 Radiculopathy, lumbar region: Secondary | ICD-10-CM

## 2021-02-18 MED ORDER — DIAZEPAM 5 MG PO TABS
5.0000 mg | ORAL_TABLET | Freq: Once | ORAL | Status: AC
  Administered 2021-02-18: 5 mg via ORAL

## 2021-02-18 MED ORDER — IOPAMIDOL (ISOVUE-M 200) INJECTION 41%
18.0000 mL | Freq: Once | INTRAMUSCULAR | Status: AC
  Administered 2021-02-18: 18 mL via INTRATHECAL

## 2021-02-18 NOTE — Progress Notes (Signed)
Patient reports she has been off Zoloft for 48 hours

## 2021-02-18 NOTE — Discharge Instructions (Addendum)
Myelogram Discharge Instructions  Go home and rest quietly as needed. You may resume normal activities; however, do not exert yourself strongly or do any heavy lifting today and tomorrow.   DO NOT drive today.    You may resume your normal diet and medications unless otherwise indicated. Drink lots of extra fluids today and tomorrow.   The incidence of headache, nausea, or vomiting is about 5% (one in 20 patients).  If you develop a headache, lie flat for 24 hours and drink plenty of fluids until the headache goes away.  Caffeinated beverages may be helpful. If when you get up you still have a headache when standing, go back to bed and force fluids for another 24 hours.   If you develop severe nausea and vomiting or a headache that does not go away with the flat bedrest after 48 hours, please call [phone].   Call your physician for a follow-up appointment.  The results of your myelogram will be sent directly to your physician by the following day.  If you have any questions or if complications develop after you arrive home, please call [phone].  Discharge instructions have been explained to the patient.  The patient, or the person responsible for the patient, fully understands these instructions.   Thank you for visiting our office today.    YOU MAY RESUME YOUR ZOLOFT TOMORROW 02/19/21 AT 1:30 PM OR AFTER

## 2021-03-24 ENCOUNTER — Other Ambulatory Visit: Payer: Self-pay | Admitting: Neurosurgery

## 2021-05-30 ENCOUNTER — Other Ambulatory Visit (HOSPITAL_COMMUNITY): Payer: BLUE CROSS/BLUE SHIELD

## 2021-06-03 ENCOUNTER — Inpatient Hospital Stay (HOSPITAL_COMMUNITY): Admission: RE | Admit: 2021-06-03 | Payer: Medicare Other | Source: Home / Self Care | Admitting: Neurosurgery

## 2021-06-03 ENCOUNTER — Encounter (HOSPITAL_COMMUNITY): Admission: RE | Payer: Self-pay | Source: Home / Self Care

## 2021-06-03 SURGERY — POSTERIOR LUMBAR FUSION 2 LEVEL
Anesthesia: General | Site: Back

## 2021-07-10 IMAGING — RF DG C-ARM 1-60 MIN
1 series · 2 of 2 positions shown · non-contrast
Comparison: None.

CLINICAL DATA: L3-4 XLIF

EXAM:
DG C-ARM 1-60 MIN; LUMBAR SPINE - 2-3 VIEW

[Series 1: run · 2 of 2 slices shown]
[im 1/2]
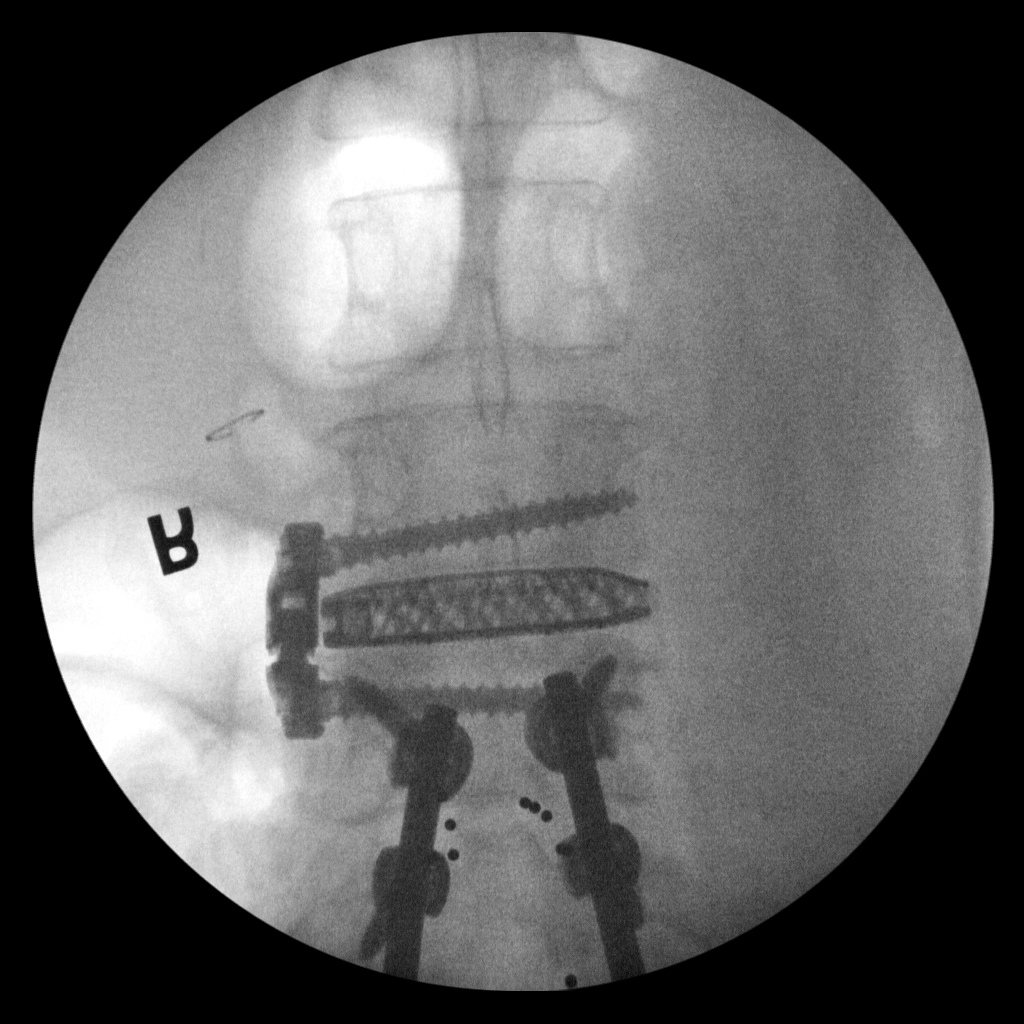
[im 2/2]
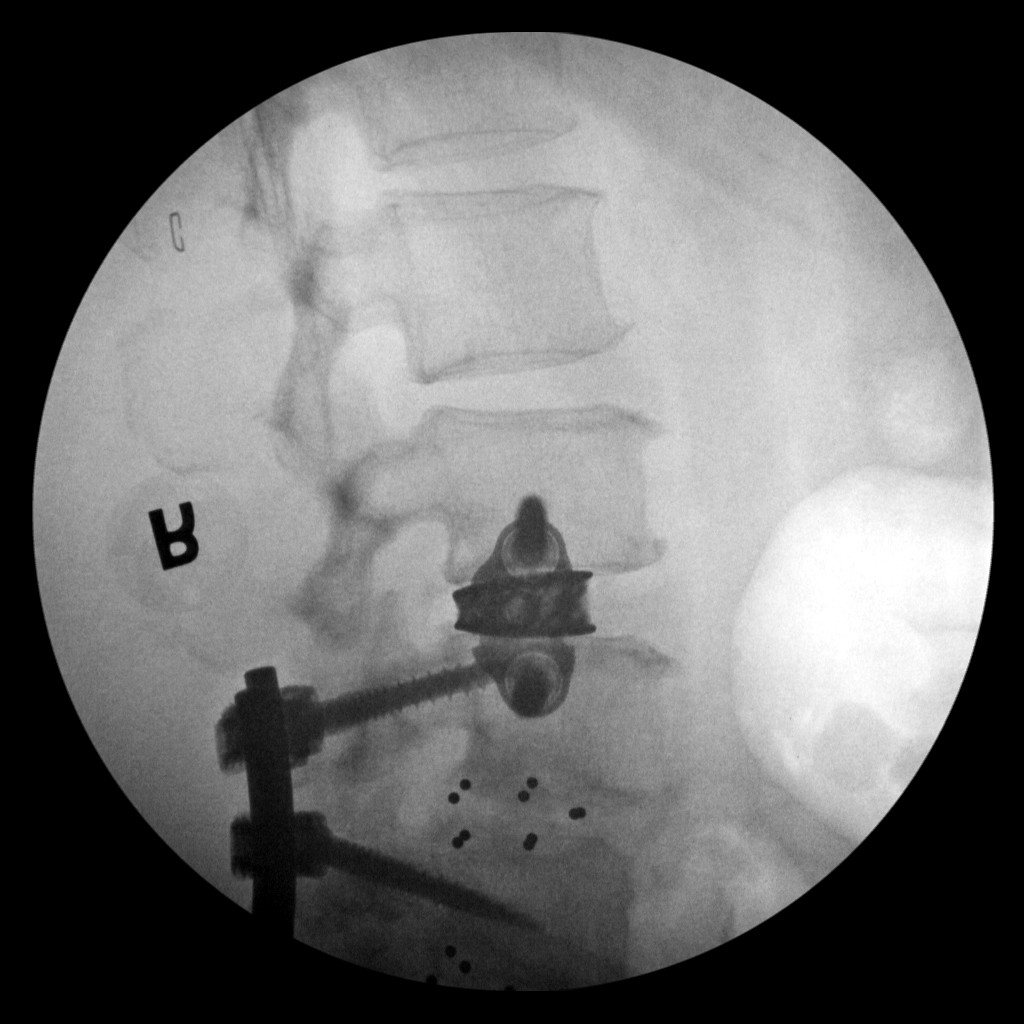

[2 of 2 positions shown; findings below may reference images not displayed]

FLUOROSCOPY TIME:  Fluoroscopy Time:  1 minutes 22 seconds

Radiation Exposure Index (if provided by the fluoroscopic device):
63.76 mGy

Number of Acquired Spot Images: 2
FINDINGS: Changes of fusion at L3-4 with lateral fixation on the right are
seen. Prior changes of lumbar fusion from L4-S1 are seen and stable.
No radiopaque foreign body is noted on these limited images.
IMPRESSION: Status post L3-4 fusion with lateral fixation on the right. No
radiopaque foreign body is noted.

Critical Value/emergent results were called by telephone at the time
of interpretation on 09/10/2020 at [DATE] to Jim in [facility], who
verbally acknowledged these results.
